# Patient Record
Sex: Male | Born: 1980 | Hispanic: Yes | Marital: Married | Smoking: Former smoker
Health system: Southern US, Community
[De-identification: ages and names within clinical notes are randomized; demographics above are authoritative.]

## PROBLEM LIST (undated history)

## (undated) DIAGNOSIS — C9590 Leukemia, unspecified not having achieved remission: Secondary | ICD-10-CM

## (undated) DIAGNOSIS — E119 Type 2 diabetes mellitus without complications: Secondary | ICD-10-CM

## (undated) HISTORY — DX: Leukemia, unspecified not having achieved remission: C95.90

## (undated) HISTORY — PX: CHEST SURGERY: SHX595

## (undated) HISTORY — DX: Type 2 diabetes mellitus without complications: E11.9

---

## 2011-05-27 DIAGNOSIS — C9200 Acute myeloblastic leukemia, not having achieved remission: Secondary | ICD-10-CM | POA: Insufficient documentation

## 2011-05-27 DIAGNOSIS — Z9481 Bone marrow transplant status: Secondary | ICD-10-CM | POA: Insufficient documentation

## 2012-01-07 DIAGNOSIS — H109 Unspecified conjunctivitis: Secondary | ICD-10-CM | POA: Insufficient documentation

## 2012-05-13 DIAGNOSIS — D89813 Graft-versus-host disease, unspecified: Secondary | ICD-10-CM | POA: Insufficient documentation

## 2013-11-12 ENCOUNTER — Ambulatory Visit (INDEPENDENT_AMBULATORY_CARE_PROVIDER_SITE_OTHER): Payer: BC Managed Care – PPO | Admitting: Family Medicine

## 2013-11-12 VITALS — BP 110/70 | HR 121 | Temp 100.0°F | Resp 18 | Ht 62.5 in | Wt 190.4 lb

## 2013-11-12 DIAGNOSIS — C9200 Acute myeloblastic leukemia, not having achieved remission: Secondary | ICD-10-CM

## 2013-11-12 DIAGNOSIS — J029 Acute pharyngitis, unspecified: Secondary | ICD-10-CM

## 2013-11-12 LAB — POCT CBC
Granulocyte percent: 55.7 %G (ref 37–80)
HCT, POC: 43.3 % — AB (ref 43.5–53.7)
Hemoglobin: 14.2 g/dL (ref 14.1–18.1)
Lymph, poc: 1.7 (ref 0.6–3.4)
MCH, POC: 31.6 pg — AB (ref 27–31.2)
MCHC: 32.8 g/dL (ref 31.8–35.4)
MCV: 96.5 fL (ref 80–97)
MID (cbc): 0.4 (ref 0–0.9)
MPV: 8.6 fL (ref 0–99.8)
POC Granulocyte: 2.6 (ref 2–6.9)
POC LYMPH PERCENT: 36.1 %L (ref 10–50)
POC MID %: 8.2 %M (ref 0–12)
Platelet Count, POC: 200 10*3/uL (ref 142–424)
RBC: 4.49 M/uL — AB (ref 4.69–6.13)
RDW, POC: 13.6 %
WBC: 4.7 10*3/uL (ref 4.6–10.2)

## 2013-11-12 LAB — POCT RAPID STREP A (OFFICE): Rapid Strep A Screen: NEGATIVE

## 2013-11-12 MED ORDER — AMOXICILLIN 875 MG PO TABS: 875.0000 mg | ORAL_TABLET | Freq: Two times a day (BID) | ORAL | Status: DC

## 2013-11-12 NOTE — Progress Notes (Signed)
This chart was scribed for Robyn Haber, MD by Vernell Barrier, Medical Scribe. This patient's care was started at 6:33 PM  Patient ID: Tim Calderon MRN: [id number], DOB: [birth year], 33 y.o. Date of Encounter: 11/12/2013, 6:32 PM  Primary Physician: No primary provider on file.  Chief Complaint: fever and sore throat  HPI: 33 y.o. year old male with history below presents with fever, sore throat, and mild cough onset 2 days ago. 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 Pt works in Architect.   Past Medical History  Diagnosis Date   Leukemia      Home Meds: Prior to Admission medications   Medication Sig Start Date End Date Taking? Authorizing Provider  ibuprofen (ADVIL,MOTRIN) 100 MG tablet Take 100 mg by mouth as needed for fever.   Yes Historical Provider, MD    Allergies:  Allergies  Allergen Reactions   Cefepime    Ceftazidime    Cytarabine    Vancomycin     History   Social History   Marital Status: Married    Spouse Name: N/A    Number of Children: N/A   Years of Education: N/A   Occupational History   Not on file.   Social History Main Topics   Smoking status: Never Smoker    Smokeless tobacco: Not on file   Alcohol Use: Not on file   Drug Use: Not on file   Sexual Activity: Not on file   Other Topics Concern   Not on file   Social History Narrative   No narrative on file     Review of Systems: Constitutional: negative for chills, night sweats, weight changes, or fatigue. Positive for fever. HEENT: negative for vision changes, hearing loss, congestion, rhinorrhea, ST, epistaxis, or sinus pressure. Positive for sore throat. Cardiovascular: negative for chest pain or palpitations Respiratory: negative for hemoptysis, wheezing, shortness of breath. Positive for cough. Abdominal: negative for abdominal pain, nausea, vomiting, diarrhea, or constipation Dermatological:  negative for rash Neurologic: negative for headache, dizziness, or syncope All other systems reviewed and are otherwise negative with the exception to those above and in the HPI.   Physical Exam: Blood pressure 110/70, pulse 121, temperature 100 F (37.8 C), temperature source Oral, resp. rate 18, height 5' 2.5" (1.588 m), weight 190 lb 6.4 oz (86.365 kg), SpO2 96.00%., Body mass index is 34.25 kg/(m^2). General: Well developed, well nourished, in no acute distress. Head: Normocephalic, atraumatic, eyes without discharge, sclera non-icteric, nares are without discharge. Bilateral auditory canals clear, TM's are without perforation, pearly grey and translucent with reflective cone of light bilaterally. Oral cavity moist, posterior pharynx without exudate, erythema, peritonsillar abscess, or post nasal drip.  Neck: Supple. No thyromegaly. Full ROM. No lymphadenopathy. Lungs: Clear bilaterally to auscultation without wheezes, rales, or rhonchi. Breathing is unlabored. Heart: RRR with S1 S2. No murmurs, rubs, or gallops appreciated. Abdomen: Soft, non-tender, non-distended with normoactive bowel sounds. No hepatomegaly. No rebound/guarding. No obvious abdominal masses. Msk:  Strength and tone normal for age. Extremities/Skin: Warm and dry. No clubbing or cyanosis. No edema. No rashes or suspicious lesions. Neuro: Alert and oriented X 3. Moves all extremities spontaneously. Gait is normal. CNII-XII grossly in tact. Psych:  Responds to questions appropriately with a normal affect.   Labs: Results for orders placed in visit on 11/12/13  POCT CBC      Result Value Ref Range   WBC 4.7  4.6 - 10.2 K/uL  Lymph, poc 1.7  0.6 - 3.4   POC LYMPH PERCENT 36.1  10 - 50 %L   MID (cbc) 0.4  0 - 0.9   POC MID % 8.2  0 - 12 %M   POC Granulocyte 2.6  2 - 6.9   Granulocyte percent 55.7  37 - 80 %G   RBC 4.49 (*) 4.69 - 6.13 M/uL   Hemoglobin 14.2  14.1 - 18.1 g/dL   HCT, POC 43.3 (*) 43.5 - 53.7 %   MCV  96.5  80 - 97 fL   MCH, POC 31.6 (*) 27 - 31.2 pg   MCHC 32.8  31.8 - 35.4 g/dL   RDW, POC 13.6     Platelet Count, POC 200  142 - 424 K/uL   MPV 8.6  0 - 99.8 fL  POCT RAPID STREP A (OFFICE)      Result Value Ref Range   Rapid Strep A Screen Negative  Negative      ASSESSMENT AND PLAN:  33 y.o. year old male with acute pharyngitis with fever.  H/O myelocitic leukemia    Signed, Robyn Haber, MD 11/12/2013 6:32 PM

## 2013-11-12 NOTE — Patient Instructions (Signed)
Dolor de garganta  (Sore Throat)  El dolor de garganta es el dolor, ardor, irritacin o sensacin de picazn en la garganta. Generalmente hay dolor o molestias al tragar o hablar. Un dolor de garganta puede estar acompaado de otros sntomas, como tos, estornudos, fiebre y ganglios hinchados en el cuello. Generalmente es Software engineer signo de otra enfermedad, como un resfrio, gripe, anginas o mononucleosis (conocida como mono). La mayor parte de los dolores de garganta desaparecen sin tratamiento mdico. CAUSAS  Las causas ms comunes de dolor de garganta son:   Infecciones virales, como un resfrio, gripe o mononucleosis.  Infeccin bacteriana, como faringitis estreptoccica, amigdalitis, o tos ferina.  Alergias estacionales.  La sequedad en el aire.  Algunos irritantes, como el humo o la polucin.  Reflujo gastroesofgico. INSTRUCCIONES PARA EL CUIDADO EN EL HOGAR   Tome slo la medicacin que le indic el mdico.  Debe ingerir gran cantidad de lquido para mantener la orina de tono claro o color amarillo plido.  Descanse todo lo que sea necesario.  Trate de usar [employer] para la garganta, pastillas o chupe caramelos duros para Best boy (si es mayor de 4 aos o segn lo que le indiquen).  Beba lquidos calientes, como caldos, infusiones de hierbas o agua caliente con miel para calmar el dolor momentneamente. Tambin puede comer o beber lquidos fros o congelados tales como paletas de hielo congelado.  Haga grgaras con agua con sal (mezclar 1 cucharadita de sal en 8 onzas [250 cm3] de agua).  No fume, y evite el humo de otros fumadores.  Ponga un humidificador de vapor fro en la habitacin por la noche para humedecer el aire. Tambin se puede activar en una ducha de agua caliente y sentarse en el bao con la puerta cerrada durante 5-10 minutos. SOLICITE ATENCIN MDICA DE INMEDIATO SI:   Tiene dificultad para respirar.  No puede tragar lquidos, alimentos blandos, o  su saliva.  Usted tiene ms inflamacin en la garganta.  El dolor de garganta no mejora en 7 das.  Tiene nuseas o vmitos.  Tiene fiebre o sntomas que persisten durante ms de 2 o 3 das.  Tiene fiebre y los sntomas empeoran de manera sbita. ASEGRESE DE QUE:   Comprende estas instrucciones.  Controlar su enfermedad.  Solicitar ayuda de inmediato si no mejora o si empeora. Document Released: 08/11/2005 Document Revised: 07/28/2012 [facility] Patient Information 2014 [place], [place]. Sore Throat A sore throat is pain, burning, irritation, or scratchiness of the throat. There is often pain or tenderness when swallowing or talking. A sore throat may be accompanied by other symptoms, such as coughing, sneezing, fever, and swollen neck glands. A sore throat is often the first sign of another sickness, such as a cold, flu, strep throat, or mononucleosis (commonly known as mono). Most sore throats go away without medical treatment. CAUSES  The most common causes of a sore throat include:  A viral infection, such as a cold, flu, or mono.  A bacterial infection, such as strep throat, tonsillitis, or whooping cough.  Seasonal allergies.  Dryness in the air.  Irritants, such as smoke or pollution.  Gastroesophageal reflux disease (GERD). HOME CARE INSTRUCTIONS   Only take over-the-counter medicines as directed by your caregiver.  Drink enough fluids to keep your urine clear or pale yellow.  Rest as needed.  Try using throat sprays, lozenges, or sucking on hard candy to ease any pain (if older than 4 years or as directed).  Sip warm liquids, such as  broth, herbal tea, or warm water with honey to relieve pain temporarily. You may also eat or drink cold or frozen liquids such as frozen ice pops.  Gargle with salt water (mix 1 tsp salt with 8 oz of water).  Do not smoke and avoid secondhand smoke.  Put a cool-mist humidifier in your bedroom at night to moisten the air. You  can also turn on a hot shower and sit in the bathroom with the door closed for 5 10 minutes. SEEK IMMEDIATE MEDICAL CARE IF:  You have difficulty breathing.  You are unable to swallow fluids, soft foods, or your saliva.  You have increased swelling in the throat.  Your sore throat does not get better in 7 days.  You have nausea and vomiting.  You have a fever or persistent symptoms for more than 2 3 days.  You have a fever and your symptoms suddenly get worse. MAKE SURE YOU:   Understand these instructions.  Will watch your condition.  Will get help right away if you are not doing well or get worse. Document Released: 09/18/2004 Document Revised: 07/28/2012 Document Reviewed: 04/18/2012 [facility] Patient Information 2014 [place], [place].

## 2016-01-11 DIAGNOSIS — C9201 Acute myeloblastic leukemia, in remission: Secondary | ICD-10-CM | POA: Diagnosis not present

## 2016-01-11 DIAGNOSIS — M25562 Pain in left knee: Secondary | ICD-10-CM | POA: Diagnosis not present

## 2016-01-11 DIAGNOSIS — R21 Rash and other nonspecific skin eruption: Secondary | ICD-10-CM | POA: Diagnosis not present

## 2016-01-11 DIAGNOSIS — Z9484 Stem cells transplant status: Secondary | ICD-10-CM | POA: Diagnosis not present

## 2016-01-11 DIAGNOSIS — Z9481 Bone marrow transplant status: Secondary | ICD-10-CM | POA: Diagnosis not present

## 2016-01-11 DIAGNOSIS — M25569 Pain in unspecified knee: Secondary | ICD-10-CM | POA: Diagnosis not present

## 2016-01-11 DIAGNOSIS — M25561 Pain in right knee: Secondary | ICD-10-CM | POA: Diagnosis not present

## 2016-01-11 DIAGNOSIS — Z9889 Other specified postprocedural states: Secondary | ICD-10-CM | POA: Diagnosis not present

## 2016-01-11 DIAGNOSIS — Z01812 Encounter for preprocedural laboratory examination: Secondary | ICD-10-CM | POA: Diagnosis not present

## 2016-05-09 DIAGNOSIS — Z23 Encounter for immunization: Secondary | ICD-10-CM | POA: Diagnosis not present

## 2016-07-29 DIAGNOSIS — Z3189 Encounter for other procreative management: Secondary | ICD-10-CM | POA: Diagnosis not present

## 2017-01-16 DIAGNOSIS — C9201 Acute myeloblastic leukemia, in remission: Secondary | ICD-10-CM | POA: Diagnosis not present

## 2017-01-16 DIAGNOSIS — Z9481 Bone marrow transplant status: Secondary | ICD-10-CM | POA: Diagnosis not present

## 2017-01-16 DIAGNOSIS — Z9484 Stem cells transplant status: Secondary | ICD-10-CM | POA: Diagnosis not present

## 2017-01-16 DIAGNOSIS — N469 Male infertility, unspecified: Secondary | ICD-10-CM | POA: Diagnosis not present

## 2017-05-26 ENCOUNTER — Ambulatory Visit (INDEPENDENT_AMBULATORY_CARE_PROVIDER_SITE_OTHER): Payer: BLUE CROSS/BLUE SHIELD | Admitting: Physician Assistant

## 2017-05-26 ENCOUNTER — Encounter: Payer: Self-pay | Admitting: Physician Assistant

## 2017-05-26 VITALS — BP 110/60 | HR 71 | Temp 98.7°F | Resp 16 | Ht 63.0 in | Wt 203.6 lb

## 2017-05-26 DIAGNOSIS — Z13228 Encounter for screening for other metabolic disorders: Secondary | ICD-10-CM | POA: Diagnosis not present

## 2017-05-26 DIAGNOSIS — Z0000 Encounter for general adult medical examination without abnormal findings: Secondary | ICD-10-CM | POA: Diagnosis not present

## 2017-05-26 DIAGNOSIS — Z1329 Encounter for screening for other suspected endocrine disorder: Secondary | ICD-10-CM | POA: Diagnosis not present

## 2017-05-26 DIAGNOSIS — Z130 Encounter for screening for diseases of the blood and blood-forming organs and certain disorders involving the immune mechanism: Secondary | ICD-10-CM

## 2017-05-26 DIAGNOSIS — Z23 Encounter for immunization: Secondary | ICD-10-CM

## 2017-05-26 NOTE — Patient Instructions (Signed)
     IF you received an x-ray today, you will receive an invoice from [facility]. Please contact [facility] at [phone] with questions or concerns regarding your invoice.   IF you received labwork today, you will receive an invoice from [facility]. Please contact [facility] at [phone] with questions or concerns regarding your invoice.   Our billing staff will not be able to assist you with questions regarding bills from these companies.  You will be contacted with the lab results as soon as they are available. The fastest way to get your results is to activate your My Chart account. Instructions are located on the last page of this paperwork. If you have not heard from us regarding the results in 2 weeks, please contact this office.     

## 2017-05-26 NOTE — Progress Notes (Signed)
    05/26/2017 5:48 PM   DOB: 07/02/1981 / MRN: [id number]  SUBJECTIVE:  Tim Calderon is a 36 y.o. male presenting for annual exam.  He has a history of acute myeloid leukemia and was treated by bone marrow transplant at [facility] and tells me that he is in remission as of right now.  He takes no medication. Tells me that all of his family has diabetes.   There is no immunization history for the selected administration types on file for this patient.   He is allergic to cefepime; ceftazidime; cytarabine; and vancomycin.   He  has a past medical history of Leukemia ([place]).    He  reports that he has never smoked. He has never used smokeless tobacco. He reports that he does not use drugs. He  has no sexual activity history on file. The patient  has a past surgical history that includes Chest surgery.  His family history includes Diabetes in his brother, father, mother, and sister.  Review of Systems  Constitutional: Negative for chills, diaphoresis and fever.  Eyes: Negative.   Respiratory: Negative for cough, hemoptysis, sputum production, shortness of breath and wheezing.   Cardiovascular: Negative for chest pain, orthopnea and leg swelling.  Gastrointestinal: Negative for abdominal pain, blood in stool, constipation, diarrhea, heartburn, melena, nausea and vomiting.  Genitourinary: Negative for flank pain.  Skin: Negative for rash.  Neurological: Negative for dizziness, sensory change, speech change, focal weakness and headaches.    The problem list and medications were reviewed and updated by myself where necessary and exist elsewhere in the encounter.   OBJECTIVE:  BP 110/60   Pulse 71   Temp 98.7 F (37.1 C) (Oral)   Resp 16   Ht 5\' 3"  (1.6 m)   Wt 203 lb 9.6 oz (92.4 kg)   SpO2 97%   BMI 36.07 kg/m   Physical Exam  Constitutional: He is oriented to person, place, and time. He appears well-developed. He is active and cooperative.  Non-toxic appearance.    Cardiovascular: Normal rate and regular rhythm.   Pulmonary/Chest: Effort normal and breath sounds normal. No tachypnea.  Musculoskeletal: Normal range of motion. He exhibits no edema.  Neurological: He is alert and oriented to person, place, and time.  Skin: Skin is warm and dry. He is not diaphoretic. No pallor.  Vitals reviewed.   No results found for this or any previous visit (from the past 72 hour(s)).  No results found.  ASSESSMENT AND PLAN:  [name] was seen today for establish care.  Diagnoses and all orders for this visit:  Physical exam  Need for diphtheria-tetanus-pertussis (Tdap) vaccine -     Tdap vaccine greater than or equal to 7yo IM  Needs flu shot -     Flu Vaccine QUAD 36+ mos IM  Screening for endocrine, metabolic and immunity disorder -     Lipid panel -     Hemoglobin A1c -     HIV antibody -     Basic metabolic panel    The patient is advised to call or return to clinic if he does not see an improvement in symptoms, or to seek the care of the closest emergency department if he worsens with the above plan.   Philis Fendt, MHS, PA-C [facility] 05/26/2017 5:48 PM

## 2017-05-27 LAB — LIPID PANEL
CHOL/HDL RATIO: 5.6 ratio — AB (ref 0.0–5.0)
Cholesterol, Total: 179 mg/dL (ref 100–199)
HDL: 32 mg/dL — AB (ref >39)
LDL CALC: 79 mg/dL (ref 0–99)
TRIGLYCERIDES: 340 mg/dL — AB (ref 0–149)
VLDL CHOLESTEROL CAL: 68 mg/dL — AB (ref 5–40)

## 2017-05-27 LAB — HEMOGLOBIN A1C
Est. average glucose Bld gHb Est-mCnc: 117 mg/dL
Hgb A1c MFr Bld: 5.7 % — ABNORMAL HIGH (ref 4.8–5.6)

## 2017-05-27 LAB — BASIC METABOLIC PANEL
BUN / CREAT RATIO: 15 (ref 9–20)
BUN: 14 mg/dL (ref 6–20)
CO2: 19 mmol/L — ABNORMAL LOW (ref 20–29)
Calcium: 9.3 mg/dL (ref 8.7–10.2)
Chloride: 105 mmol/L (ref 96–106)
Creatinine, Ser: 0.92 mg/dL (ref 0.76–1.27)
GFR calc Af Amer: 123 mL/min/{1.73_m2} (ref >59)
GFR, EST NON AFRICAN AMERICAN: 107 mL/min/{1.73_m2} (ref >59)
GLUCOSE: 103 mg/dL — AB (ref 65–99)
POTASSIUM: 4.3 mmol/L (ref 3.5–5.2)
Sodium: 141 mmol/L (ref 134–144)

## 2017-05-27 LAB — HIV ANTIBODY (ROUTINE TESTING W REFLEX): HIV Screen 4th Generation wRfx: NONREACTIVE

## 2017-05-27 NOTE — Progress Notes (Signed)
Please make patient aware of results via letter. I'd like to see him back in one year to trend these labs.  He is a very early prediabetes.  Eating less carbohydrate/sugar and getting more exercise.  Philis Fendt PA-C, 05/27/2017 11:57 AM

## 2017-05-28 ENCOUNTER — Encounter: Payer: Self-pay | Admitting: *Deleted

## 2017-08-12 DIAGNOSIS — Z3189 Encounter for other procreative management: Secondary | ICD-10-CM | POA: Diagnosis not present

## 2018-01-22 DIAGNOSIS — K1370 Unspecified lesions of oral mucosa: Secondary | ICD-10-CM | POA: Diagnosis not present

## 2018-01-22 DIAGNOSIS — Z9484 Stem cells transplant status: Secondary | ICD-10-CM | POA: Diagnosis not present

## 2018-01-22 DIAGNOSIS — Z9481 Bone marrow transplant status: Secondary | ICD-10-CM | POA: Diagnosis not present

## 2018-01-22 DIAGNOSIS — C9201 Acute myeloblastic leukemia, in remission: Secondary | ICD-10-CM | POA: Diagnosis not present

## 2018-01-22 DIAGNOSIS — N469 Male infertility, unspecified: Secondary | ICD-10-CM | POA: Diagnosis not present

## 2018-04-05 ENCOUNTER — Encounter: Payer: Self-pay | Admitting: Urgent Care

## 2018-04-05 ENCOUNTER — Ambulatory Visit (INDEPENDENT_AMBULATORY_CARE_PROVIDER_SITE_OTHER): Payer: BLUE CROSS/BLUE SHIELD | Admitting: Urgent Care

## 2018-04-05 VITALS — BP 114/74 | HR 69 | Temp 97.5°F | Resp 18 | Ht 63.0 in | Wt 204.6 lb

## 2018-04-05 DIAGNOSIS — Z1329 Encounter for screening for other suspected endocrine disorder: Secondary | ICD-10-CM | POA: Diagnosis not present

## 2018-04-05 DIAGNOSIS — Z833 Family history of diabetes mellitus: Secondary | ICD-10-CM

## 2018-04-05 DIAGNOSIS — R635 Abnormal weight gain: Secondary | ICD-10-CM

## 2018-04-05 DIAGNOSIS — Z130 Encounter for screening for diseases of the blood and blood-forming organs and certain disorders involving the immune mechanism: Secondary | ICD-10-CM

## 2018-04-05 DIAGNOSIS — Z856 Personal history of leukemia: Secondary | ICD-10-CM

## 2018-04-05 DIAGNOSIS — Z0000 Encounter for general adult medical examination without abnormal findings: Secondary | ICD-10-CM

## 2018-04-05 DIAGNOSIS — Z13228 Encounter for screening for other metabolic disorders: Secondary | ICD-10-CM

## 2018-04-05 DIAGNOSIS — Z6836 Body mass index (BMI) 36.0-36.9, adult: Secondary | ICD-10-CM

## 2018-04-05 DIAGNOSIS — E669 Obesity, unspecified: Secondary | ICD-10-CM

## 2018-04-05 NOTE — Progress Notes (Signed)
MRN: [id number]  Subjective:   [name] is a 37 y.o. male presenting for an annual physical exam. Patient is married, has children. Has good relationships at home, has a good support network. Works as a Glass blower/designer. Denies smoking cigarettes or drinking alcohol.   Medical care team includes: PCP: Patient, No Pcp Per Vision: No visual deficits. Dental: Gets regular dental care. Specialists: Has had leukemia in the past, currently in remission, gets follow up in [facility].  Health Maintenance: Age appropriate screening completed.  Tim Calderon is not currently taking any medications. He is allergic to cefepime; ceftazidime; cytarabine; and vancomycin. Tim Calderon  has a past medical history of Leukemia ([place]). Also  has a past surgical history that includes Chest surgery. His family history includes Diabetes in his brother, father, mother, and sister.  Review of Systems  Constitutional: Negative for chills, diaphoresis, fever, malaise/fatigue and weight loss.  HENT: Negative for congestion, ear discharge, ear pain, hearing loss, nosebleeds, sore throat and tinnitus.   Eyes: Negative for blurred vision, double vision, photophobia, pain, discharge and redness.  Respiratory: Negative for cough, shortness of breath and wheezing.   Cardiovascular: Negative for chest pain, palpitations and leg swelling.  Gastrointestinal: Negative for abdominal pain, blood in stool, constipation, diarrhea, nausea and vomiting.  Genitourinary: Negative for dysuria, flank pain, frequency, hematuria and urgency.  Musculoskeletal: Negative for back pain, joint pain and myalgias.  Skin: Negative for itching and rash.  Neurological: Negative for dizziness, tingling, seizures, loss of consciousness, weakness and headaches.  Endo/Heme/Allergies: Negative for polydipsia.  Psychiatric/Behavioral: Negative for depression, hallucinations, memory loss, substance abuse and suicidal ideas. The patient is not  nervous/anxious and does not have insomnia.    Objective:   Vitals: BP 114/74   Pulse 69   Temp (!) 97.5 F (36.4 C) (Oral)   Resp 18   Ht 5\' 3"  (1.6 m)   Wt 204 lb 9.6 oz (92.8 kg)   SpO2 95%   BMI 36.24 kg/m   Physical Exam  Constitutional: He is oriented to person, place, and time. He appears well-developed and well-nourished.  HENT:  TM's intact bilaterally, no effusions or erythema. Nasal turbinates pink and moist, nasal passages patent. No sinus tenderness. Oropharynx clear, mucous membranes moist, dentition in good repair.  Eyes: Pupils are equal, round, and reactive to light. Conjunctivae and EOM are normal. Right eye exhibits no discharge. Left eye exhibits no discharge. No scleral icterus.  Neck: Normal range of motion. Neck supple. No thyromegaly present.  Cardiovascular: Normal rate, regular rhythm, normal heart sounds and intact distal pulses. Exam reveals no gallop and no friction rub.  No murmur heard. Pulmonary/Chest: Effort normal and breath sounds normal. No stridor. No respiratory distress. He has no wheezes. He has no rales.  Abdominal: Soft. Bowel sounds are normal. He exhibits no distension and no mass. There is no tenderness. There is no rebound and no guarding.  Musculoskeletal: Normal range of motion. He exhibits no edema or tenderness.  Lymphadenopathy:    He has no cervical adenopathy.  Neurological: He is alert and oriented to person, place, and time. He has normal reflexes. He displays normal reflexes. Coordination normal.  Skin: Skin is warm and dry. No rash noted. No erythema. No pallor.  Psychiatric: He has a normal mood and affect.   Assessment and Plan :   Annual physical exam  Screening for endocrine, metabolic and immunity disorder  Screening for deficiency anemia - Plan: CBC  Family history of diabetes mellitus  Class 2 obesity without serious comorbidity with body mass index (BMI) of 36.0 to 36.9 in adult, unspecified obesity type -  Plan: Comprehensive metabolic panel, Lipid panel, TSH  History of leukemia - Plan: CBC, Comprehensive metabolic panel, Lipid panel  Patient is medically stable and very pleasant.  Labs pending.  States that he should have a DOT and respiratory physical which he states he will do so or else provided by his employer.  States that this is been the case in the past.  Counseled that if he needs a DOT we can perform that here.  Patient will return to clinic as needed. Discussed healthy lifestyle, diet, exercise, preventative care, vaccinations, and addressed patient's concerns.     Jaynee Eagles, PA-C [facility] [phone] 04/05/2018  2:59 PM

## 2018-04-05 NOTE — Patient Instructions (Addendum)
Mantenimiento de Scientist, forensic hombres (Health Maintenance, Male) Un estilo de vida saludable y los cuidados preventivos son importantes para la salud y Musician. Pregntele al mdico cul es el cronograma de exmenes peridicos adecuado para usted. [place]? Consuma una dieta saludable  Coma muchas verduras, frutas, cereales integrales, productos lcteos con bajo contenido de grasa y Advertising account planner.  No consuma muchos alimentos de alto contenido de grasas slidas, azcares agregados o sal. Mantenga un peso saludable La actividad fsica habitual puede ayudarlo a Science writer o mantener un peso saludable. Deber hacer lo siguiente:  Realizar al menos 152minutos de actividad fsica por semana. El ejercicio debe aumentar la frecuencia cardaca y Actor la transpiracin (ejercicio de [place]).  Hacer ejercicios de entrenamiento de fuerza por lo [employer] por semana. Controlarse los niveles de colesterol y lpidos en la sangre  Hgase anlisis de sangre para controlar los lpidos y el colesterol cada 5aos a partir de los 35aos. Si tiene un riesgo alto de Best boy cardiopatas coronarias, debe comenzar a [employer] de [place] a los [place]. Es posible que Automotive engineer los niveles de colesterol con mayor frecuencia si: ? Sus niveles de lpidos y colesterol son altos. ? Es mayor de 73UKG. ? Tiene un riesgo alto de tener cardiopatas coronarias. QU DEBO SABER SOBRE LAS PRUEBAS DE [place]? Muchos tipos de cncer se pueden detectar de manera temprana y a menudo prevenirse. Cncer de pulmn  Debe someterse a pruebas de deteccin de cncer de pulmn todos los aos en los siguientes casos: ? Si fuma actualmente y lo ha hecho durante por lo menos 30aos. ? Si fue fumador que dej el hbito en el trmino de los ltimos 15aos.  Hable con el mdico sobre las opciones en relacin con los estudios de deteccin, cundo  debe comenzar a Insurance underwriter y con Armed forces operational officer. Cncer colorrectal  Generalmente, las pruebas de deteccin habituales del cncer colorrectal comienzan a los 50aos y deben repetirse cada 5 a 10aos hasta los 75aos. Es posible que tenga que hacerse las pruebas con mayor frecuencia si se detectan formas tempranas de plipos precancerosos o pequeos bultos. Sin embargo, el mdico podr aconsejarle que lo haga antes, si tiene factores de riesgo para el cncer de colon.  El mdico puede recomendarle que use kits de prueba caseros para Publishing rights manager oculta en la materia fecal.  Se puede usar una pequea cmara en el extremo de un tubo para examinar el colon (sigmoidoscopia o colonoscopia). Este estudio [employer] formas ms tempranas de Surveyor, minerals. Cncer de prstata y de testculo  En funcin de la edad y del [place] de salud general, el mdico puede realizarle determinados estudios de deteccin del cncer de prstata y de testculo.  Hable con el mdico sobre cualquier sntoma o acerca de las inquietudes que tenga sobre el cncer de prstata o de testculo. Cncer de piel  Revise la piel de la cabeza a los pies con regularidad.  Informe al mdico si aparecen nuevos lunares o si nota cambios en los que ya tiene, especialmente en estos casos: ? Si hay un cambio en el tamao, la forma o el color del lunar. ? Si tiene un lunar que es ms grande que el tamao de una goma de Games developer.  Siempre use pantalla solar. Aplquese pantalla solar de Lanell Matar y repetida a lo largo del Training and development officer.  Use mangas y [employer], un sombrero de ala ancha y gafas para  el sol cuando est al Alexis Goodell, para protegerse. QU DEBO SABER SOBRE LAS CARDIOPATAS CORONARIAS, LA DIABETES Y LA HIPERTENSIN ARTERIAL?  Si usted tiene entre 18 y 71aos, debe medirse la presin arterial cada 3a 5aos. Si usted tiene 40aos o ms, debe medirse la presin arterial [employer]. Debe medirse la presin arterial  dos veces: una vez cuando est en un hospital o una clnica y la otra vez cuando est en otro sitio. Registre el promedio de [employer]. Para controlar su presin arterial cuando no est en un hospital o [place], puede usar lo siguiente: ? Jorje Guild automtica para medir la presin arterial en una farmacia. ? Un monitor para medir la presin arterial en el hogar.  Hable con el mdico [employer] ideales de la presin arterial.  Si tiene entre 8 y 79aos, consltele al mdico si debe tomar aspirina para evitar las cardiopatas coronarias.  Hgase anlisis habituales de deteccin de la diabetes; para ello, contrlese la glucemia en ayunas. ? Si su peso es normal y tiene un bajo riesgo de padecer diabetes, realcese este anlisis cada tres aos despus de los 45aos. ? Si tiene sobrepeso y un alto riesgo de padecer diabetes, considere someterse a este anlisis antes o con mayor frecuencia.  Para los hombres que tienen entre 72 y 34aos, y son o han sido fumadores, se recomienda un nico estudio con ecografa para Hydrographic surveyor un aneurisma de aorta abdominal (AAA). QU DEBO SABER SOBRE LA PREVENCIN DE LAS INFECCIONES? HepatitisB Si tiene un riesgo ms alto de Museum/gallery curator hepatitis B, debe someterse a un examen de deteccin de este virus. Hable con el mdico para determinar si corre riesgo de tener una infeccin por hepatitisB. Hepatitis C Se recomienda un anlisis de [place] para:  Todos los que nacieron entre 1945 y [phone].  Todas las personas que tengan un riesgo de haber contrado hepatitis C. Enfermedades de transmisin sexual (ETS)  Debe realizarse pruebas de Programme researcher, broadcasting/film/video de las ETS todos los aos, incluidas la gonorrea y la clamidia, en estos casos: ? Es sexualmente activo y es menor de [place]. ? Es mayor de 24aos, y Investment banker, operational informa que corre riesgo de tener este tipo de infecciones. ? La actividad sexual ha cambiado desde que le hicieron la ltima prueba de  deteccin y tiene un riesgo mayor de Best boy clamidia o Radio broadcast assistant. Pregntele al mdico si usted tiene riesgo.  Consulte a su mdico para saber si tiene un alto riesgo de infectarse por el VIH. El mdico puede recomendarle un medicamento de venta con receta para ayudar a evitar la infeccin por el VIH. QU MS PUEDO HACER?  Realcese los estudios de rutina de la salud, dentales y de Public librarian.  [place] (inmunizaciones).  No consuma ningn producto que contenga tabaco, lo que incluye cigarrillos, tabaco de Higher education careers adviser y Psychologist, sport and exercise. Si necesita ayuda para dejar de fumar, consulte al mdico.  Limite el consumo de alcohol a no ms de 32medidas por da. [employer] a 12 onzas de cerveza, 5onzas de vino o 1onzas de bebidas alcohlicas de alta graduacin.  No consuma drogas.  No comparta agujas.  Solicite ayuda a su mdico si necesita apoyo o informacin para abandonar las drogas.  Informe a su mdico si a menudo se siente deprimido.  Notifique a su mdico si alguna vez ha sido vctima de abuso o si no se siente seguro en su hogar. Esta informacin no tiene Psychologist, clinical  consejo del mdico. Asegrese de hacerle al mdico cualquier pregunta que tenga. Document Released: 02/07/2008 Document Revised: 09/01/2014 Document Reviewed: 05/15/2015 Elsevier Interactive Patient Education  2018 [employer].     IF you received an x-ray today, you will receive an invoice from [facility]. Please contact [facility] at [phone] with questions or concerns regarding your invoice.   IF you received labwork today, you will receive an invoice from [place]. Please contact [facility] at [phone] with questions or concerns regarding your invoice.   Our billing staff will not be able to assist you with questions regarding bills from these companies.  You will be contacted with the lab results as soon as they are available. The fastest  way to get your results is to activate your My Chart account. Instructions are located on the last page of this paperwork. If you have not heard from [place] regarding the results in 2 weeks, please contact this office.

## 2018-04-06 LAB — COMPREHENSIVE METABOLIC PANEL
A/G RATIO: 1.5 (ref 1.2–2.2)
ALK PHOS: 63 IU/L (ref 39–117)
ALT: 57 IU/L — ABNORMAL HIGH (ref 0–44)
AST: 36 IU/L (ref 0–40)
Albumin: 4.3 g/dL (ref 3.5–5.5)
BILIRUBIN TOTAL: 0.3 mg/dL (ref 0.0–1.2)
BUN/Creatinine Ratio: 12 (ref 9–20)
BUN: 15 mg/dL (ref 6–20)
CO2: 22 mmol/L (ref 20–29)
Calcium: 9 mg/dL (ref 8.7–10.2)
Chloride: 105 mmol/L (ref 96–106)
Creatinine, Ser: 1.21 mg/dL (ref 0.76–1.27)
GFR calc Af Amer: 88 mL/min/{1.73_m2} (ref >59)
GFR calc non Af Amer: 76 mL/min/{1.73_m2} (ref >59)
GLOBULIN, TOTAL: 2.9 g/dL (ref 1.5–4.5)
Glucose: 111 mg/dL — ABNORMAL HIGH (ref 65–99)
Potassium: 4.2 mmol/L (ref 3.5–5.2)
SODIUM: 141 mmol/L (ref 134–144)
Total Protein: 7.2 g/dL (ref 6.0–8.5)

## 2018-04-06 LAB — CBC
Hematocrit: 44.2 % (ref 37.5–51.0)
Hemoglobin: 14.7 g/dL (ref 13.0–17.7)
MCH: 32 pg (ref 26.6–33.0)
MCHC: 33.3 g/dL (ref 31.5–35.7)
MCV: 96 fL (ref 79–97)
PLATELETS: 229 10*3/uL (ref 150–450)
RBC: 4.59 x10E6/uL (ref 4.14–5.80)
RDW: 14.5 % (ref 12.3–15.4)
WBC: 6.6 10*3/uL (ref 3.4–10.8)

## 2018-04-06 LAB — LIPID PANEL
CHOLESTEROL TOTAL: 165 mg/dL (ref 100–199)
Chol/HDL Ratio: 5 ratio (ref 0.0–5.0)
HDL: 33 mg/dL — ABNORMAL LOW (ref >39)
LDL Calculated: 79 mg/dL (ref 0–99)
TRIGLYCERIDES: 266 mg/dL — AB (ref 0–149)
VLDL Cholesterol Cal: 53 mg/dL — ABNORMAL HIGH (ref 5–40)

## 2018-04-06 LAB — TSH: TSH: 1.67 u[IU]/mL (ref 0.450–4.500)

## 2018-05-18 DIAGNOSIS — Z3189 Encounter for other procreative management: Secondary | ICD-10-CM | POA: Diagnosis not present

## 2018-06-12 DIAGNOSIS — Z3189 Encounter for other procreative management: Secondary | ICD-10-CM | POA: Diagnosis not present

## 2019-01-06 DIAGNOSIS — Z3189 Encounter for other procreative management: Secondary | ICD-10-CM | POA: Diagnosis not present

## 2019-06-17 DIAGNOSIS — L299 Pruritus, unspecified: Secondary | ICD-10-CM | POA: Diagnosis not present

## 2019-06-17 DIAGNOSIS — Z9484 Stem cells transplant status: Secondary | ICD-10-CM | POA: Diagnosis not present

## 2019-06-17 DIAGNOSIS — L819 Disorder of pigmentation, unspecified: Secondary | ICD-10-CM | POA: Diagnosis not present

## 2019-06-17 DIAGNOSIS — Z23 Encounter for immunization: Secondary | ICD-10-CM | POA: Diagnosis not present

## 2019-06-17 DIAGNOSIS — C9201 Acute myeloblastic leukemia, in remission: Secondary | ICD-10-CM | POA: Diagnosis not present

## 2019-06-17 DIAGNOSIS — Z9481 Bone marrow transplant status: Secondary | ICD-10-CM | POA: Diagnosis not present

## 2020-02-22 DIAGNOSIS — Z3189 Encounter for other procreative management: Secondary | ICD-10-CM | POA: Diagnosis not present

## 2020-10-21 ENCOUNTER — Other Ambulatory Visit: Payer: Self-pay

## 2020-10-21 ENCOUNTER — Emergency Department (HOSPITAL_COMMUNITY)
Admission: EM | Admit: 2020-10-21 | Discharge: 2020-10-21 | Disposition: A | Discharge status: Home / Self Care | Payer: BC Managed Care – PPO | Attending: Emergency Medicine | Admitting: Emergency Medicine

## 2020-10-21 ENCOUNTER — Emergency Department (HOSPITAL_COMMUNITY): Payer: BC Managed Care – PPO

## 2020-10-21 DIAGNOSIS — M791 Myalgia, unspecified site: Secondary | ICD-10-CM | POA: Insufficient documentation

## 2020-10-21 DIAGNOSIS — R197 Diarrhea, unspecified: Secondary | ICD-10-CM | POA: Insufficient documentation

## 2020-10-21 DIAGNOSIS — R509 Fever, unspecified: Secondary | ICD-10-CM | POA: Diagnosis not present

## 2020-10-21 DIAGNOSIS — R1032 Left lower quadrant pain: Secondary | ICD-10-CM | POA: Insufficient documentation

## 2020-10-21 DIAGNOSIS — Z20822 Contact with and (suspected) exposure to covid-19: Secondary | ICD-10-CM | POA: Diagnosis not present

## 2020-10-21 LAB — HEPATITIS PANEL, ACUTE
HCV Ab: NONREACTIVE
Hep A IgM: NONREACTIVE
Hep B C IgM: NONREACTIVE
Hepatitis B Surface Ag: NONREACTIVE

## 2020-10-21 LAB — COMPREHENSIVE METABOLIC PANEL
ALT: 177 U/L — ABNORMAL HIGH (ref 0–44)
AST: 115 U/L — ABNORMAL HIGH (ref 15–41)
Albumin: 3.5 g/dL (ref 3.5–5.0)
Alkaline Phosphatase: 60 U/L (ref 38–126)
Anion gap: 10 (ref 5–15)
BUN: 14 mg/dL (ref 6–20)
CO2: 22 mmol/L (ref 22–32)
Calcium: 8.3 mg/dL — ABNORMAL LOW (ref 8.9–10.3)
Chloride: 103 mmol/L (ref 98–111)
Creatinine, Ser: 1.06 mg/dL (ref 0.61–1.24)
GFR, Estimated: >60 mL/min (ref >60)
Glucose, Bld: 157 mg/dL — ABNORMAL HIGH (ref 70–99)
Potassium: 3.7 mmol/L (ref 3.5–5.1)
Sodium: 135 mmol/L (ref 135–145)
Total Bilirubin: 0.8 mg/dL (ref 0.3–1.2)
Total Protein: 6.8 g/dL (ref 6.5–8.1)

## 2020-10-21 LAB — CBC WITH DIFFERENTIAL/PLATELET
Abs Immature Granulocytes: 0.01 10*3/uL (ref 0.00–0.07)
Basophils Absolute: 0 10*3/uL (ref 0.0–0.1)
Basophils Relative: 1 %
Eosinophils Absolute: 0 10*3/uL (ref 0.0–0.5)
Eosinophils Relative: 0 %
HCT: 42.9 % (ref 39.0–52.0)
Hemoglobin: 14.8 g/dL (ref 13.0–17.0)
Immature Granulocytes: 0 %
Lymphocytes Relative: 37 %
Lymphs Abs: 1.6 10*3/uL (ref 0.7–4.0)
MCH: 32.2 pg (ref 26.0–34.0)
MCHC: 34.5 g/dL (ref 30.0–36.0)
MCV: 93.3 fL (ref 80.0–100.0)
Monocytes Absolute: 0.5 10*3/uL (ref 0.1–1.0)
Monocytes Relative: 11 %
Neutro Abs: 2.3 10*3/uL (ref 1.7–7.7)
Neutrophils Relative %: 51 %
Platelets: 144 10*3/uL — ABNORMAL LOW (ref 150–400)
RBC: 4.6 MIL/uL (ref 4.22–5.81)
RDW: 12.9 % (ref 11.5–15.5)
WBC: 4.4 10*3/uL (ref 4.0–10.5)
nRBC: 0 % (ref 0.0–0.2)

## 2020-10-21 LAB — LIPASE, BLOOD: Lipase: 43 U/L (ref 11–51)

## 2020-10-21 LAB — SARS CORONAVIRUS 2 (TAT 6-24 HRS): SARS Coronavirus 2: NEGATIVE

## 2020-10-21 MED ORDER — IOHEXOL 300 MG/ML  SOLN
100.0000 mL | Freq: Once | INTRAMUSCULAR | Status: AC | PRN
  Administered 2020-10-21: 100 mL via INTRAVENOUS

## 2020-10-21 MED ORDER — SODIUM CHLORIDE 0.9 % IV BOLUS
1000.0000 mL | Freq: Once | INTRAVENOUS | Status: AC
  Administered 2020-10-21: 1000 mL via INTRAVENOUS

## 2020-10-21 NOTE — ED Notes (Signed)
Patient transported to CT 

## 2020-10-21 NOTE — ED Provider Notes (Signed)
[facility] EMERGENCY DEPARTMENT Provider Note   CSN: [id number] Arrival date & time: 10/21/20  1307     History Chief Complaint  Patient presents with  . Diarrhea    Tim Calderon is a 40 y.o. male.  Tim Calderon is a 40 y.o. male with a history of leukemia in remission, who presents to the ED for evaluation of diarrhea and fever over the past 3 days.  He reports Friday he started having profuse diarrhea he reports numerous episodes of watery stools throughout the day.  No melena or hematochezia.  Denies nausea or vomiting.  Reports some cramping abdominal pain that gets worse before he has a bowel movement but no persistent pain.  Reports fevers up to 101 at home.  No cough, congestion, sore throat, and no known sick contacts.  Reports patient's son had 1 day of fever but no associated diarrhea.  No food out of the ordinary recently.  Has taken Tylenol for the fever with improvement, no other medications prior to arrival.  Has not been on any antibiotics recently.  Does report that today he has had some myalgias which he attributes to dehydration.  He has had decreased p.o. intake, because when he eats he feels like this goes straight through him.        Past Medical History:  Diagnosis Date  . Leukemia [facility])     Patient Active Problem List   Diagnosis Date Noted  . 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 CHEST SURGERY         Family History  Problem Relation Age of Onset  . Diabetes Mother   . Diabetes Father   . Diabetes Sister   . Diabetes Brother     Social History   Tobacco Use  . Smoking status: Never Smoker  . Smokeless tobacco: Never Used  Substance Use Topics  . Drug use: No    Home Medications Prior to Admission medications   Not on File    Allergies    Cefepime, Ceftazidime, Cytarabine, and  Vancomycin  Review of Systems   Review of Systems  Constitutional: Negative for chills and fever.  HENT: Negative.   Respiratory: Negative for cough and shortness of breath.   Cardiovascular: Negative for chest pain.  Gastrointestinal: Positive for abdominal pain and diarrhea. Negative for anal bleeding, blood in stool, nausea and vomiting.  Genitourinary: Negative for dysuria and frequency.  Musculoskeletal: Positive for myalgias. Negative for arthralgias.  Skin: Negative for color change and rash.  Neurological: Negative for dizziness, syncope and light-headedness.  All other systems reviewed and are negative.   Physical Exam Updated Vital Signs BP 121/80 (BP Location: Right Arm)   Pulse 93   Temp 99.8 F (37.7 C) (Oral)   Resp 20   SpO2 98%   Physical Exam Vitals and nursing note reviewed.  Constitutional:      General: He is not in acute distress.    Appearance: Normal appearance. He is well-developed and well-nourished. He is not ill-appearing or diaphoretic.     Comments: Well-appearing and in no distress   HENT:     Head: Normocephalic and atraumatic.     Mouth/Throat:     Mouth: Oropharynx is clear and moist.     Comments: Slightly dry Eyes:     General:        Right eye:  No discharge.        Left eye: No discharge.     Extraocular Movements: EOM normal.  Cardiovascular:     Rate and Rhythm: Normal rate and regular rhythm.     Pulses: Normal pulses and intact distal pulses.     Heart sounds: Normal heart sounds. No murmur heard. No gallop.   Pulmonary:     Effort: Pulmonary effort is normal. No respiratory distress.     Breath sounds: Normal breath sounds. No wheezing or rales.     Comments: Respirations equal and unlabored, patient able to speak in full sentences, lungs clear to auscultation bilaterally  Abdominal:     General: Bowel sounds are normal. There is no distension.     Palpations: Abdomen is soft. There is no mass.     Tenderness: There is  abdominal tenderness. There is no guarding.     Comments: Abdomen is soft, nondistended, bowel sounds present and hyperactive throughout, there is some mild tenderness in the left lower quadrant, all other quadrants nontender, no guarding or peritoneal signs.  Musculoskeletal:        General: No deformity or edema.     Cervical back: Neck supple.  Skin:    General: Skin is warm and dry.     Capillary Refill: Capillary refill takes less than 2 seconds.  Neurological:     Mental Status: He is alert.     Coordination: Coordination normal.     Comments: Speech is clear, able to follow commands Moves extremities without ataxia, coordination intact  Psychiatric:        Mood and Affect: Mood normal.        Behavior: Behavior normal.     ED Results / Procedures / Treatments   Labs (all labs ordered are listed, but only abnormal results are displayed) Labs Reviewed  COMPREHENSIVE METABOLIC PANEL - Abnormal; Notable for the following components:      Result Value   Glucose, Bld 157 (*)    Calcium 8.3 (*)    AST 115 (*)    ALT 177 (*)    All other components within normal limits  CBC WITH DIFFERENTIAL/PLATELET - Abnormal; Notable for the following components:   Platelets 144 (*)    All other components within normal limits  GASTROINTESTINAL PANEL BY PCR, STOOL (REPLACES STOOL CULTURE)  LIPASE, BLOOD  HEPATITIS PANEL, ACUTE    EKG None  Radiology No results found.  Procedures Procedures   Medications Ordered in ED Medications  sodium chloride 0.9 % bolus 1,000 mL (1,000 mLs Intravenous New Bag/Given 10/21/20 1400)  sodium chloride 0.9 % bolus 1,000 mL (1,000 mLs Intravenous New Bag/Given 10/21/20 1515)    ED Course  I have reviewed the triage vital signs and the nursing notes.  Pertinent labs & imaging results that were available during my care of the patient were reviewed by me and considered in my medical decision making (see chart for details).    MDM  Rules/Calculators/A&P                         Patient presents to the ED with complaints of 3 days of diarrhea, with fevers up to 101, mild cramping abdominal pain. Patient nontoxic appearing, in no apparent distress, vitals WNL. On exam patient with some mild left lower quadrant tenderness, no peritoneal signs. Will evaluate with labs and CT abdomen pelvis.  Low suspicion for C. difficile as patient has not been on any antibiotics,  but will check GI pathogen panel.  Will give fluids, patient denies nausea and is not reporting severe pain at this time we will hold off on further medications.  Ddx: Viral gastroenteritis, colitis, diverticulitis, foodborne illness, inflammatory bowel disease  I have independently ordered, reviewed and interpreted all labs and imaging:  CBC: No leukocytosis, normal hemoglobin CMP: Glucose of 157, no other significant electrolyte derangements, normal renal function, patient does have mild transaminitis with AST of 115 and ALT of 177, no focal right upper quadrant tenderness, could be in the setting of viral GI bug, will also send hepatitis panel. Lipase: WNL  CT pending at shift change, care signed out to resident Dr. Lanny Hurst who will follow up on CT imaging and reevaluate patient, if CT shows signs of colitis would consider antibiotics given persistent diarrhea for 3 days now without signs of improvement.  If CT is negative suspect viral etiology and patient can likely be discharged home with continued supportive treatment.  Final Clinical Impression(s) / ED Diagnoses Final diagnoses:  Diarrhea, unspecified type  LLQ abdominal pain    Rx / DC Orders ED Discharge Orders    None       Janet Berlin 10/21/20 1529    Charlesetta Shanks, MD 10/22/20 [phone]

## 2020-10-21 NOTE — ED Provider Notes (Signed)
  Physical Exam  BP (!) 97/55   Pulse 71   Temp 99.8 F (37.7 C) (Oral)   Resp 15   Ht 5\' 2"  (1.575 m)   Wt 93.4 kg   SpO2 97%   BMI 37.68 kg/m   Physical Exam Vitals and nursing note reviewed.  Constitutional:      General: He is awake. He is not in acute distress.    Appearance: Normal appearance. He is well-developed and well-groomed. He is obese. He is not ill-appearing.  HENT:     Head: Normocephalic and atraumatic.     Right Ear: External ear normal.     Left Ear: External ear normal.     Nose: Nose normal.  Eyes:     General: No scleral icterus.       Right eye: No discharge.        Left eye: No discharge.     Conjunctiva/sclera: Conjunctivae normal.  Cardiovascular:     Rate and Rhythm: Normal rate and regular rhythm.  Pulmonary:     Effort: Pulmonary effort is normal. No respiratory distress.  Abdominal:     General: Abdomen is flat. There is no distension.     Palpations: Abdomen is soft.     Tenderness: There is no abdominal tenderness. There is no guarding or rebound. Negative signs include Murphy's sign, Rovsing's sign and McBurney's sign.  Skin:    General: Skin is warm and dry.     Findings: No rash.  Neurological:     General: No focal deficit present.     Mental Status: He is alert and oriented to person, place, and time.  Psychiatric:        Mood and Affect: Mood normal.        Behavior: Behavior normal. Behavior is cooperative.     ED Course/Procedures     Procedures None  MDM  Assumed care from [facility] PA-C at 1500. Please refer to her not for full H&P and initial MDM. Briefly, patient is a 86yoM presenting with watery non-bloody diarrhea for the last 3 days with LLQ abdominal tenderness on exam. Mild transaminitis on initial blood work, but otherwise reassuring. No RUQ tenderness, but hepatitis panel ordered. GI pathogen panel ordered as well. Plan will be to follow up CT results.  CT demonstrated colonic diverticulosis without acute  diverticulitis, gas fluid levels throughout colon suggestive of diarrhea illness, and mild diffuse hepatic steatosis. On reassessment, patient resting comfortably with benign abdominal exam. Suspect presentation likely viral enteritis. COVID testing added on. Doubt SBO, diverticulitis, colitis, nephrolithiasis, UTI, pyelonephritis, appendicitis, perforated viscus, AAA, cholecystitis, pancreatitis, or other acute emergent pathology at this time. Recommend symptomatic treatment at home with aggressive hydration and close follow up with PCP to review hepatitis panel and redraw LFTs since LFTs mildly elevated today. Contact information provided in AVS for patient to establish himself with new PCP. Patient otherwise HDS and appropriate for discharge. Strict return precautions provided and discussed. Questions and concerns addressed. Patient verbalized understanding and amenable with discharge plan. Discharged in stable condition.       Christy Gentles, MD 10/22/20 Vita Barley    Noemi Chapel, MD 10/23/20 1024

## 2020-10-21 NOTE — ED Triage Notes (Signed)
Pt c/o diarrhea and fever x3 days. Pt denies abd pain, n/v, decreased appetite.

## 2021-03-05 ENCOUNTER — Emergency Department (HOSPITAL_COMMUNITY)
Admission: EM | Admit: 2021-03-05 | Discharge: 2021-03-05 | Disposition: A | Discharge status: Home / Self Care | Payer: BC Managed Care – PPO | Attending: Emergency Medicine | Admitting: Emergency Medicine

## 2021-03-05 ENCOUNTER — Encounter (HOSPITAL_COMMUNITY): Payer: Self-pay | Admitting: *Deleted

## 2021-03-05 DIAGNOSIS — R739 Hyperglycemia, unspecified: Secondary | ICD-10-CM | POA: Insufficient documentation

## 2021-03-05 DIAGNOSIS — R42 Dizziness and giddiness: Secondary | ICD-10-CM | POA: Diagnosis not present

## 2021-03-05 DIAGNOSIS — E1165 Type 2 diabetes mellitus with hyperglycemia: Secondary | ICD-10-CM | POA: Diagnosis not present

## 2021-03-05 DIAGNOSIS — R5383 Other fatigue: Secondary | ICD-10-CM | POA: Diagnosis not present

## 2021-03-05 LAB — CBC WITH DIFFERENTIAL/PLATELET
Abs Immature Granulocytes: 0 10*3/uL (ref 0.00–0.07)
Basophils Absolute: 0.1 10*3/uL (ref 0.0–0.1)
Basophils Relative: 1 %
Eosinophils Absolute: 0.1 10*3/uL (ref 0.0–0.5)
Eosinophils Relative: 3 %
HCT: 43.1 % (ref 39.0–52.0)
Hemoglobin: 15.4 g/dL (ref 13.0–17.0)
Immature Granulocytes: 0 %
Lymphocytes Relative: 48 %
Lymphs Abs: 2.7 10*3/uL (ref 0.7–4.0)
MCH: 33.2 pg (ref 26.0–34.0)
MCHC: 35.7 g/dL (ref 30.0–36.0)
MCV: 92.9 fL (ref 80.0–100.0)
Monocytes Absolute: 0.3 10*3/uL (ref 0.1–1.0)
Monocytes Relative: 5 %
Neutro Abs: 2.5 10*3/uL (ref 1.7–7.7)
Neutrophils Relative %: 43 %
Platelets: 208 10*3/uL (ref 150–400)
RBC: 4.64 MIL/uL (ref 4.22–5.81)
RDW: 12.9 % (ref 11.5–15.5)
WBC: 5.7 10*3/uL (ref 4.0–10.5)
nRBC: 0 % (ref 0.0–0.2)

## 2021-03-05 LAB — URINALYSIS, ROUTINE W REFLEX MICROSCOPIC
Bacteria, UA: NONE SEEN
Bilirubin Urine: NEGATIVE
Glucose, UA: >=500 mg/dL — AB
Hgb urine dipstick: NEGATIVE
Ketones, ur: NEGATIVE mg/dL
Leukocytes,Ua: NEGATIVE
Nitrite: NEGATIVE
Protein, ur: NEGATIVE mg/dL
Specific Gravity, Urine: 1.029 (ref 1.005–1.030)
pH: 6 (ref 5.0–8.0)

## 2021-03-05 LAB — COMPREHENSIVE METABOLIC PANEL
ALT: 247 U/L — ABNORMAL HIGH (ref 0–44)
AST: 191 U/L — ABNORMAL HIGH (ref 15–41)
Albumin: 3.7 g/dL (ref 3.5–5.0)
Alkaline Phosphatase: 97 U/L (ref 38–126)
Anion gap: 8 (ref 5–15)
BUN: 7 mg/dL (ref 6–20)
CO2: 25 mmol/L (ref 22–32)
Calcium: 8.9 mg/dL (ref 8.9–10.3)
Chloride: 103 mmol/L (ref 98–111)
Creatinine, Ser: 0.77 mg/dL (ref 0.61–1.24)
GFR, Estimated: >60 mL/min (ref >60)
Glucose, Bld: 332 mg/dL — ABNORMAL HIGH (ref 70–99)
Potassium: 3.9 mmol/L (ref 3.5–5.1)
Sodium: 136 mmol/L (ref 135–145)
Total Bilirubin: 1.2 mg/dL (ref 0.3–1.2)
Total Protein: 7 g/dL (ref 6.5–8.1)

## 2021-03-05 LAB — CBG MONITORING, ED: Glucose-Capillary: 317 mg/dL — ABNORMAL HIGH (ref 70–99)

## 2021-03-05 MED ORDER — SODIUM CHLORIDE 0.9 % IV BOLUS
1000.0000 mL | Freq: Once | INTRAVENOUS | Status: AC
  Administered 2021-03-05: 1000 mL via INTRAVENOUS

## 2021-03-05 NOTE — ED Provider Notes (Signed)
Emergency Medicine Provider Triage Evaluation Note  GEORGIA BARIA , a 40 y.o. male  was evaluated in triage.  Pt complains of dizziness.  Symptoms began yesterday, self resolved currently.  No associated leg, paresthesias, weakness, facial droop, chest pain, shortness of breath.  No visual field changes.  Review of Systems  Positive: Dizziness Negative: HA, numbness, CP Shortness of breath  Physical Exam  BP 123/81   Pulse 72   Temp 98.2 F (36.8 C) (Oral)   Resp 14   SpO2 95%  Gen:   Awake, no distress   Resp:  Normal effort  MSK:   Moves extremities without difficulty  Other:  CN 2-12 grossly intact. Equal hand grip, intact sensation bilaterally  Medical Decision Making  Medically screening exam initiated at 8:28 AM.  Appropriate orders placed.  Nuchem Grattan Kannan was informed that the remainder of the evaluation will be completed by another provider, this initial triage assessment does not replace that evaluation, and the importance of remaining in the ED until their evaluation is complete.  Dizziness  VS stable   Anas Reister A, PA-C 03/05/21 0830    Lajean Saver, MD 03/06/21 [phone]

## 2021-03-05 NOTE — ED Provider Notes (Signed)
[facility] EMERGENCY DEPARTMENT Provider Note   CSN: [id number] Arrival date & time: 03/05/21  0539     History Chief Complaint  Patient presents with   Dizziness    Iosefa ERIS BRECK is a 40 y.o. male.  Patient is a 40 year old male with a history of prior AML in remission who presents with generalized fatigue.  He has had some intermittent dizziness over the last week.  He denies any spinning sensation.  He says he has been urinating frequently and he says his water tastes like sugar.  He does have a family history of diabetes but he has no known history of diabetes.  He denies any chest pain.  No palpitations.  No shortness of breath.  No nausea vomiting or diarrhea.      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 Authorizing Provider  ibuprofen (ADVIL) 200 MG tablet Take 400 mg by mouth every 6 (six) hours as needed for mild pain.   Yes [provider]    Allergies    Shrimp (diagnostic), Cefepime, Ceftazidime, Cytarabine, and Vancomycin  Review of Systems   Review of Systems  Constitutional:  Positive for fatigue. Negative for chills, diaphoresis and fever.  HENT:  Negative for congestion, rhinorrhea and sneezing.   Eyes: Negative.   Respiratory:  Negative for cough, chest tightness and shortness of breath.   Cardiovascular:  Negative for  chest pain and leg swelling.  Gastrointestinal:  Negative for abdominal pain, blood in stool, diarrhea, nausea and vomiting.  Genitourinary:  Positive for frequency. Negative for difficulty urinating, flank pain and hematuria.  Musculoskeletal:  Negative for arthralgias and back pain.  Skin:  Negative for rash.  Neurological:  Positive for dizziness and light-headedness. Negative for speech difficulty, weakness, numbness and headaches.   Physical Exam Updated Vital Signs BP 111/77   Pulse (!) 50   Temp 98.2 F (36.8 C) (Oral)   Resp 16   SpO2 100%   Physical Exam Constitutional:      Appearance: He is well-developed.  HENT:     Head: Normocephalic and atraumatic.  Eyes:     Pupils: Pupils are equal, round, and reactive to light.  Cardiovascular:     Rate and Rhythm: Normal rate and regular rhythm.     Heart sounds: Normal heart sounds.  Pulmonary:     Effort: Pulmonary effort is normal. No respiratory distress.     Breath sounds: Normal breath sounds. No wheezing or rales.  Chest:     Chest wall: No tenderness.  Abdominal:     General: Bowel sounds are normal.     Palpations: Abdomen is soft.     Tenderness: There is no abdominal tenderness. There is no guarding or rebound.  Musculoskeletal:        General: Normal range of motion.     Cervical back: Normal range of motion and neck supple.  Lymphadenopathy:     Cervical: No cervical adenopathy.  Skin:    General: Skin is warm and dry.     Findings: No rash.  Neurological:     Mental Status: He is alert and oriented to person, place, and time.     Comments: Motor 5/5 all extremities Sensation grossly intact to LT all extremities Finger to Nose intact, no pronator drift CN II-XII grossly intact Gait normal     ED Results / Procedures / Treatments   Labs (all labs ordered are listed, but only abnormal results are displayed) Labs Reviewed  COMPREHENSIVE METABOLIC PANEL - Abnormal; Notable for the following  components:      Result Value   Glucose, Bld 332 (*)    AST 191 (*)    ALT 247 (*)    All other components within normal limits  URINALYSIS, ROUTINE W REFLEX MICROSCOPIC - Abnormal; Notable for the following components:   Color, Urine STRAW (*)    Glucose, UA >=500 (*)    All other components within normal limits  CBC WITH DIFFERENTIAL/PLATELET    EKG EKG Interpretation  Date/Time:  Tuesday March 05 2021 08:24:18 EDT Ventricular Rate:  79 PR Interval:  160 QRS Duration: 76 QT Interval:  368 QTC Calculation: 421 R Axis:   55 Text Interpretation: Normal sinus rhythm Abnormal QRS-T angle, consider primary T wave abnormality Abnormal ECG No old tracing to compare Confirmed by Malvin Johns [phone]) on 03/05/2021 3:34:35 PM  Radiology No results found.  Procedures Procedures   Medications Ordered in ED Medications  sodium chloride 0.9 % bolus 1,000 mL (1,000 mLs Intravenous New Bag/Given 03/05/21 1610)    ED Course  I have reviewed the triage vital signs and the nursing notes.  Pertinent labs & imaging results that were available during my care of the patient were reviewed by me and considered in my medical decision making (see chart for details).    MDM Rules/Calculators/A&P                          Patient is a 40 year old male who presents with dizziness and fatigue.  He is noted to have hyperglycemia.  There is no evidence of DKA.  He actually feels better today than he has.  His glucose is in the 300 range.  He was given IV fluids.  We were able to make an appointment tomorrow with the PCP.  He has some mild elevation in his LFTs which is stable since February.  I discussed the need for follow-up regarding this.  He did have a hepatitis panel that was negative.  He denies any heavy alcohol use.  He denies any acetaminophen use.  However given this, I was hesitant to start him on metformin.  We will have him follow-up tomorrow for medication management and education regarding  his diabetes.  Return precautions were given. Final Clinical Impression(s) / ED Diagnoses Final diagnoses:  Hyperglycemia    Rx / DC Orders ED Discharge Orders     None        Malvin Johns, MD 03/05/21 1727

## 2021-03-05 NOTE — ED Triage Notes (Signed)
To ED for eval of dizziness intermittently over the past few days, most recently yesterday. No dizziness now. States he feels 'good' right now. Pt is in remission from leukemia - 11yrs remission and does follow up with oncology. Denies pain. States he was relaxing when dizziness starts.

## 2021-03-05 NOTE — Discharge Instructions (Addendum)
Follow-up with the primary care doctor tomorrow as discussed.  Return here as needed for any worsening symptoms.

## 2021-03-05 NOTE — ED Notes (Signed)
RN reviewed discharge instructions w/ pt. Follow up care reviewed, pt had no further questions

## 2021-03-06 ENCOUNTER — Ambulatory Visit (INDEPENDENT_AMBULATORY_CARE_PROVIDER_SITE_OTHER): Payer: BC Managed Care – PPO | Admitting: Nurse Practitioner

## 2021-03-06 ENCOUNTER — Encounter: Payer: Self-pay | Admitting: Nurse Practitioner

## 2021-03-06 ENCOUNTER — Other Ambulatory Visit: Payer: Self-pay

## 2021-03-06 VITALS — BP 109/66 | HR 97 | Temp 97.3°F | Ht 63.0 in | Wt 199.0 lb

## 2021-03-06 DIAGNOSIS — C9201 Acute myeloblastic leukemia, in remission: Secondary | ICD-10-CM

## 2021-03-06 DIAGNOSIS — Z131 Encounter for screening for diabetes mellitus: Secondary | ICD-10-CM | POA: Diagnosis not present

## 2021-03-06 DIAGNOSIS — E119 Type 2 diabetes mellitus without complications: Secondary | ICD-10-CM

## 2021-03-06 DIAGNOSIS — Z9481 Bone marrow transplant status: Secondary | ICD-10-CM

## 2021-03-06 DIAGNOSIS — Z7689 Persons encountering health services in other specified circumstances: Secondary | ICD-10-CM

## 2021-03-06 DIAGNOSIS — Z23 Encounter for immunization: Secondary | ICD-10-CM

## 2021-03-06 DIAGNOSIS — D89813 Graft-versus-host disease, unspecified: Secondary | ICD-10-CM

## 2021-03-06 LAB — POCT GLYCOSYLATED HEMOGLOBIN (HGB A1C)
HbA1c POC (<> result, manual entry): 12.7 % (ref 4.0–5.6)
HbA1c, POC (controlled diabetic range): 12.7 % — AB (ref 0.0–7.0)
HbA1c, POC (prediabetic range): 12.7 % — AB (ref 5.7–6.4)
Hemoglobin A1C: 12.7 % — AB (ref 4.0–5.6)

## 2021-03-06 LAB — POCT URINALYSIS DIPSTICK
Bilirubin, UA: NEGATIVE
Blood, UA: NEGATIVE
Glucose, UA: POSITIVE — AB
Ketones, UA: NEGATIVE
Leukocytes, UA: NEGATIVE
Nitrite, UA: NEGATIVE
Protein, UA: NEGATIVE
Spec Grav, UA: 1.01 (ref 1.010–1.025)
Urobilinogen, UA: 0.2 E.U./dL
pH, UA: 5.5 (ref 5.0–8.0)

## 2021-03-06 LAB — GLUCOSE, POCT (MANUAL RESULT ENTRY): POC Glucose: 436 mg/dl — AB (ref 70–99)

## 2021-03-06 MED ORDER — GLUCOSE BLOOD VI STRP: ORAL_STRIP | 12 refills | Status: DC

## 2021-03-06 MED ORDER — COMFORT TOUCH INSULIN PEN NEED 32G X 6 MM MISC: 1.0000 "pen " | Freq: Two times a day (BID) | 11 refills | Status: DC

## 2021-03-06 MED ORDER — LEVEMIR FLEXTOUCH 100 UNIT/ML ~~LOC~~ SOPN: 20.0000 [IU] | PEN_INJECTOR | Freq: Two times a day (BID) | SUBCUTANEOUS | 11 refills | Status: DC

## 2021-03-06 MED ORDER — BLOOD GLUCOSE MONITOR KIT: PACK | 0 refills | Status: AC

## 2021-03-06 NOTE — Progress Notes (Signed)
[facility] [place], [place]  [address] Phone:  [phone]   Fax:  [phone]   New Patient Office Visit  Subjective:  Patient ID: Tim Calderon, male    DOB: 1981-04-10  Age: 40 y.o. MRN: [id number]  CC:  Chief Complaint  Patient presents with   New Patient (Initial Visit)    Was seen at ED yesterday for dizziness and frequent  urination. Stated he is currently in remission     HPI Tim Calderon presents to establish care. He  has a past medical history of Leukemia ([place]).   Diabetes Mellitus Patient presents with new onset of Type 2 diabetes. He went to the ED on yesterday but was not treated. Current symptoms include: hyperglycemia and polyuria. Symptoms have progressed to a point and plateaued. Patient denies foot ulcerations, hypoglycemia , nausea, paresthesia of the feet, polydipsia, visual disturbances, vomiting, and weight loss. Evaluation to date has included: hemoglobin A1C.  Home sugars: patient does not check sugars. Current treatment: none. He reports that his last alcohol intake was 2 weeks ago.   Past Medical History:  Diagnosis Date   Leukemia [facility])     Past Surgical History:  Procedure Laterality Date   CHEST SURGERY      Family History  Problem Relation Age of Onset   Diabetes Mother    Diabetes Father    Diabetes Sister    Diabetes Brother     Social History   Socioeconomic History   Marital status: Married    Spouse name: Not on file   Number of children: Not on file   Years of education: Not on file   Highest education level: Not on file  Occupational History   Not on file  Tobacco Use   Smoking status: Never   Smokeless tobacco: Never  Substance and Sexual Activity   Alcohol use: Yes    Comment: occ   Drug use: No   Sexual activity: Not on file  Other Topics Concern   Not on file  Social History Narrative   Not on file   Social Determinants of Health   Financial Resource Strain: Not on  file  Food Insecurity: Not on file  Transportation Needs: Not on file  Physical Activity: Not on file  Stress: Not on file  Social Connections: Not on file  Intimate Partner Violence: Not on file    ROS Review of Systems  Objective:   Today's Vitals: BP 109/66 (BP Location: Right Arm, Patient Position: Sitting)   Pulse 97   Temp (!) 97.3 F (36.3 C)   Ht '5\' 3"'  (1.6 m)   Wt 199 lb 0.2 oz (90.3 kg)   SpO2 96%   BMI 35.25 kg/m   Physical Exam Constitutional:      Appearance: He is obese.  HENT:     Head: Normocephalic and atraumatic.     Right Ear: Tympanic membrane normal.  Cardiovascular:     Rate and Rhythm: Normal rate and regular rhythm.     Pulses: Normal pulses.     Heart sounds: Normal heart sounds.  Pulmonary:     Effort: Pulmonary effort is normal.     Breath sounds: Normal breath sounds.  Abdominal:     General: Bowel sounds are normal.     Palpations: Abdomen is soft.  Musculoskeletal:     Cervical back: Normal range of motion.  Skin:    General: Skin is warm and dry.  Capillary Refill: Capillary refill takes less than 2 seconds.  Neurological:     General: No focal deficit present.     Mental Status: He is alert and oriented to person, place, and time.  Psychiatric:        Mood and Affect: Mood normal.        Behavior: Behavior normal.        Thought Content: Thought content normal.        Judgment: Judgment normal.    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 Discussed prostate age related concerns Discussed general safety in vehicle and COVID Discussed regular hydration with water Discussed  healthy diet and exercise and weight management Discussed sexual health  Discussed mental health Encouraged to call our office for an appointment with in ongoing concerns for questions.      Relevant Orders   Urinalysis Dipstick (Completed)   Screening for diabetes mellitus       Relevant Orders   HgB A1c (Completed)   Glucose (CBG) (Completed)   Microalbumin, urine (Completed)   New onset type 2 diabetes mellitus ([place])     Initiated Levemir 20 units twice daily  Encourage regular CBG monitoring Encourage contacting office if excessive hyperglycemia and or hypoglycemia Lifestyle modification with healthy diet (fewer calories, more high fiber foods, whole grains and non-starchy vegetables, lower fat meat and fish, low-fat diary include healthy oils) regular exercise (physical activity) and weight loss Opthalmology exam discussed  Nutritional consult recommended Regular dental visits encouraged Home BP monitoring also encouraged goal <130/80    Relevant Medications   insulin detemir (LEVEMIR FLEXTOUCH) 100 UNIT/ML FlexPen   Other Relevant Orders   Microalbumin, urine (Completed)       Outpatient Encounter Medications as of 03/06/2021  Medication Sig   blood glucose meter kit and supplies KIT Dispense based on patient and insurance preference. Use up to four times daily as directed.   glucose blood test strip Use as instructed   insulin detemir (LEVEMIR FLEXTOUCH) 100 UNIT/ML FlexPen Inject 20 Units into the skin 2 (two) times daily.   Insulin Pen Needle (COMFORT TOUCH INSULIN PEN NEED) 32G X 6 MM MISC 1 pen by Does not apply route in the morning and at bedtime.   [DISCONTINUED] ibuprofen (ADVIL) 200 MG tablet Take 400 mg by mouth every 6 (six) hours as needed for mild pain.   No facility-administered encounter medications on file as of 03/06/2021.    Follow-up: Return in about 4 weeks (around 04/03/2021) for am apt fasting labs, follow up [id number].   Vevelyn Francois, NP

## 2021-03-06 NOTE — Patient Instructions (Signed)
Mantenimiento de Teacher, English as a foreign language, Male Adoptar un estilo de vida saludable y recibir atencin preventiva son importantes para promover la salud y Musician. Consulte al mdico sobre: El esquema adecuado para hacerse pruebas y exmenes peridicos. Cosas que puede hacer por su cuenta para prevenir enfermedades y [place] sano. Qu debo saber sobre la dieta, el peso y el ejercicio? Consuma una dieta saludable  Consuma una dieta que incluya muchas verduras, frutas, productos lcteos con bajo contenido de [place] y Advertising account planner. No consuma muchos alimentos ricos en grasas slidas, azcares agregados o sodio.  Mantenga un peso saludable El ndice de masa muscular [facility]) es una medida que puede utilizarse para identificar posibles problemas de [place]. Proporciona una estimacin de la grasa corporal basndose en el peso y la altura. Su mdico puede ayudarle Soil scientist Mineral y a Scientist, forensic o Theatre manager un peso saludable. Haga ejercicio con regularidad Haga ejercicio con regularidad. Esta es una de las prcticas ms importantes que puede hacer por su salud. La [employer] de los adultos deben seguir estas pautas: Optometrist, al menos, 150 minutos de actividad fsica por semana. El ejercicio debe aumentar la frecuencia cardaca y Nature conservation officer transpirar (ejercicio de intensidad moderada). Hacer ejercicios de fortalecimiento por lo [employer] por semana. Agregue esto a su plan de ejercicio de intensidad moderada. Pasar menos tiempo sentados. Incluso la actividad fsica ligera puede ser beneficiosa. Controle sus niveles de colesterol y lpidos en la sangre Comience a realizarse anlisis de lpidos y colesterol en la sangre a los20 aos y luego reptalos cada 5 aos. Es posible que Automotive engineer los niveles de colesterol con mayor frecuencia si: Sus niveles de lpidos y colesterol son altos. Es mayor de 23 aos. Presenta un alto riesgo de padecer enfermedades cardacas. Qu debo  saber sobre las pruebas de deteccin del cncer? Muchos tipos de cncer pueden detectarse de manera temprana y, a menudo, pueden prevenirse. Segn su historia clnica y sus antecedentes familiares, es posible que deba realizarse pruebas de deteccin del cncer en diferentes edades. Esto puede incluir pruebas de deteccin de lo siguiente: Surveyor, minerals. Cncer de prstata. Cncer de piel. Cncer de pulmn. Qu debo saber sobre la enfermedad cardaca, la diabetes y la hipertensinarterial? Presin arterial y enfermedad cardaca La hipertensin arterial causa enfermedades cardacas y [place] el riesgo de accidente cerebrovascular. Es ms probable que esto se manifieste en las personas que tienen lecturas de presin arterial alta, tienen ascendencia africana o tienen sobrepeso. Hable con el mdico sobre sus valores de presin arterial deseados. Hgase controlar la presin arterial: Cada 3 a 5 aos si tiene entre 18 y 48 aos. Todos los aos si es mayor de 40 aos. Si tiene entre 16 y 23 aos y es fumador o Insurance account manager, pregntele al mdico si debe realizarse una prueba de deteccin de aneurisma artico abdominal (AAA) por nica vez. Diabetes Realcese exmenes de deteccin de la diabetes con regularidad. Este anlisis revisa el nivel de azcar en la sangre en [place]. Hgase las pruebas de deteccin: Cada tres aos despus de los 74 aos de edad si tiene un peso normal y un bajo riesgo de padecer diabetes. Con ms frecuencia y a partir de [place] edad inferior si tiene sobrepeso o un alto riesgo de padecer diabetes. Qu debo saber sobre la prevencin de infecciones? Hepatitis B Si tiene un riesgo ms alto de contraer hepatitis B, debe someterse a un examen de deteccin de este virus. Hable con el mdico para averiguar si tiene riesgode  contraer la infeccin por hepatitis B. Hepatitis C Se recomienda un anlisis de [place] para: Todos los que nacieron entre 1946 y [phone]. Todas las personas que  tengan un riesgo de haber contrado hepatitis C. Enfermedades de transmisin sexual (ETS) Debe realizarse pruebas de deteccin de ITS todos los aos, incluidas la gonorrea y la clamidia, si: Es sexualmente activo y es menor de 77 aos. Es mayor de 61 aos, y Investment banker, operational informa que corre riesgo de tener este tipo de infecciones. La actividad sexual ha cambiado desde que le hicieron la ltima prueba de deteccin y tiene un riesgo mayor de Best boy clamidia o Radio broadcast assistant. Pregntele al mdico si usted tiene riesgo. Pregntele al mdico si usted tiene un alto riesgo de Museum/gallery curator VIH. El mdico tambin puede recomendarle un medicamento recetado para ayudar a evitar la infeccin por el VIH. Si elige tomar medicamentos para prevenir el VIH, primero debe [employer] de deteccin del VIH. Luego debe hacerse anlisis cada 3 meses mientras est tomando los medicamentos. Siga estas instrucciones en su casa: Estilo de vida No consuma ningn producto que contenga nicotina o tabaco, como cigarrillos, cigarrillos electrnicos y tabaco de Higher education careers adviser. Si necesita ayuda para dejar de fumar, consulte al mdico. No consuma drogas. No comparta agujas. Solicite ayuda a su mdico si necesita apoyo o informacin para abandonar las drogas. Consumo de alcohol No beba alcohol si el mdico se lo prohbe. Si bebe alcohol: Limite la cantidad que consume de 0 a 2 medidas por da. Est atento a la cantidad de alcohol que hay en las bebidas que toma. En los [place], una medida equivale a una botella de cerveza de 12 oz (355 ml), un vaso de vino de 5 oz (148 ml) o un vaso de una bebida alcohlica de alta graduacin de 1 oz (44 ml). Instrucciones generales Realcese los estudios de rutina de la salud, dentales y de Public librarian. [place]. Infrmele a su mdico si: Se siente deprimido con frecuencia. Alguna vez ha sido vctima de [place] o no se siente seguro en su casa. Resumen Adoptar un estilo  de vida saludable y recibir atencin preventiva son importantes para promover la salud y Musician. Siga las instrucciones del mdico acerca de una dieta saludable, el ejercicio y la realizacin de pruebas o exmenes para Engineer, building services. Siga las instrucciones del mdico con respecto al control del colesterol y la presin arterial. Esta informacin no tiene Marine scientist el consejo del mdico. Asegresede hacerle al mdico cualquier pregunta que tenga. Document Revised: 09/01/2018 Document Reviewed: 09/01/2018 Elsevier Patient Education  [place].  Diabetes mellitus y nutricin, en adultos Diabetes Mellitus and Nutrition, Adult Si sufre de diabetes, o diabetes mellitus, es muy importante tener hbitos alimenticios saludables debido a que sus niveles de Designer, television/film set sangre (glucosa) se ven afectados en gran medida por lo que come y bebe. Comer alimentos saludables en las cantidades correctas, aproximadamente a la misma hora todos los [place], [place] ayudar a: Aeronautical engineer glucemia. Disminuir el riesgo de sufrir una enfermedad cardaca. Mejorar la presin arterial. Science writer o mantener un peso saludable. Qu puede afectar mi plan de alimentacin? Todas las personas que sufren de diabetes son diferentes y cada una tiene necesidades diferentes en cuanto a un plan de alimentacin. El mdico puede recomendarle que trabaje con un nutricionista para elaborar el mejor plan para usted. Su plan de alimentacin puede variar segn factores como: Las caloras que necesita. Los medicamentos que toma. Su  peso. Sus niveles de glucemia, presin arterial y colesterol. Su nivel de [place]. Otras afecciones que tenga, como enfermedades cardacas o renales. Cmo me afectan los carbohidratos? Los carbohidratos, o hidratos de carbono, afectan su nivel de glucemia ms que cualquier otro tipo de alimento. La ingesta de carbohidratos naturalmente aumenta la cantidad de [employer]. El  recuento de carbohidratos es un mtodo destinado a Catering manager un registro de la cantidad de carbohidratos que se consumen. El recuento de carbohidratos es importante para Theatre manager la glucemia a un nivel saludable, especialmente si utiliza insulina o toma determinadosmedicamentos por va oral para la diabetes. Es importante conocer la cantidad de carbohidratos que se pueden ingerir en cada comida sin correr Engineer, manufacturing. Esto es Psychologist, forensic. Su nutricionista puede ayudarlo a calcular la cantidad de carbohidratos que debeingerir en cada comida y en cada refrigerio. Cmo me afecta el alcohol? El alcohol puede provocar disminuciones sbitas de la glucemia (hipoglucemia), especialmente si utiliza insulina o toma determinados medicamentos por va oral para la diabetes. La hipoglucemia es una afeccin potencialmente mortal. Los sntomas de la hipoglucemia, como somnolencia, mareos y confusin, son similares a los sntomas de haber consumido demasiado alcohol. No beba alcohol si: Su mdico le indica no hacerlo. Est embarazada, puede estar embarazada o est tratando de [place]. Si bebe alcohol: No beba con el estmago vaco. Limite la cantidad que bebe: De 0 a 1 medida por da para las mujeres. De 0 a 2 medidas por da para los hombres. Est atento a la cantidad de alcohol que hay en las bebidas que toma. En los [place], una medida equivale a una botella de cerveza de 12 oz (355 ml), un vaso de vino de 5 oz (148 ml) o un vaso de una bebida alcohlica de alta graduacin de 1 oz (44 ml). Mantngase hidratado bebiendo agua, refrescos dietticos o t helado sin azcar. Tenga en cuenta que los refrescos comunes, los jugos y otras bebida para Optician, dispensing pueden contener mucha azcar y se deben contar como carbohidratos. Consejos para seguir Company secretary las etiquetas de los alimentos Comience por leer el tamao de la porcin en la "Informacin nutricional" en las etiquetas de los  alimentos envasados y las bebidas. La cantidad de caloras, carbohidratos, grasas y otros nutrientes mencionados en la etiqueta se basan en una porcin del alimento. Muchos alimentos contienen ms de una porcin por envase. Verifique la cantidad total de gramos (g) de carbohidratos totales en una porcin. Puede calcular la cantidad de porciones de carbohidratos al dividir el total de carbohidratos por 15. Por ejemplo, si un alimento tiene un total de 30 g de carbohidratos totales por porcin, equivale a 2 porciones de carbohidratos. Verifique la cantidad de gramos (g) de grasas saturadas y grasas trans de una porcin. Escoja alimentos que no contengan estas grasas o que su contenido de estas sea [place]. Verifique la cantidad de miligramos (mg) de sal (sodio) en una porcin. La State Farm de las personas deben limitar la ingesta de sodio total a menos de 2300 mg Honeywell. Siempre consulte la informacin nutricional de los alimentos etiquetados como "con bajo contenido de grasa" o "sin grasa". Estos alimentos pueden tener un mayor contenido de Location manager agregada o carbohidratos refinados, y deben evitarse. Hable con su nutricionista para identificar sus objetivos diarios en cuanto a los nutrientes mencionados en la etiqueta. Al ir de compras Evite comprar alimentos procesados, enlatados o precocidos. Estos alimentos tienden a Special educational needs teacher mayor cantidad de [place], [place]  y azcar agregada. Compre en la zona exterior de la tienda de comestibles. Esta es la zona donde se encuentran con mayor frecuencia las frutas y las verduras frescas, los cereales a granel, las carnes frescas y los productos lcteos frescos. Al cocinar Utilice mtodos de coccin a baja temperatura, como hornear, en lugar de mtodos de coccin a alta temperatura, como frer en abundante aceite. Cocine con aceites saludables, como el aceite de [place], canola o [place]. Evite cocinar con manteca, crema o carnes con alto contenido de grasa. Planificacin de  las comidas Coma las comidas y los refrigerios regularmente, preferentemente a la misma hora todos [place]. Evite pasar largos perodos de tiempo sin comer. Consuma alimentos ricos en fibra, como frutas frescas, verduras, frijoles y cereales integrales. Consulte a su nutricionista sobre cuntas porciones de carbohidratos puede consumir en cada comida. Consuma entre 4 y 6 onzas (entre 112 y 168 g) de protenas magras por da, como carnes [place], pollo, pescado, huevos o tofu. Una onza (oz) de protena magra equivale a: 1 onza (28 g) de carne, pollo o pescado. 1 huevo.  de taza (62 g) de tofu. Coma algunos alimentos por da que contengan grasas saludables, como aguacates, frutos secos, semillas y pescado. Qu alimentos debo comer? Lambert Mody Bayas. Manzanas. Naranjas. Duraznos. Damascos. Ciruelas. Uvas. Mango. Papaya.[place]. Kiwi. Cerezas. [place] Valeda Malm. Espinaca. Verduras de Boeing, que incluyen col rizada, [place], hojas de [place] y de [place]. Remolachas. Coliflor. Repollo. Brcoli. Zanahorias. Judas verdes. Tomates. Pimientos. Cebollas. Pepinos. Coles deBruselas. Granos Granos integrales, como panes, galletas, tortillas, cereales y pastas desalvado o integrales. Avena sin azcar. Quinua. Arroz integral o salvaje. Carnes y Psychiatric nurse. Carne de ave sin piel. Cortes magros de ave y carne de res. Tofu.Frutos secos. Semillas. Lcteos Productos lcteos sin grasa o con bajo contenido de [place], [place]ilda, yogur [place]. Es posible que los productos que se enumeran ms [place] no constituyan una lista completa de los alimentos y las bebidas que puede tomar. Consulte a un nutricionista para obtener ms informacin. Qu alimentos debo evitar? Lambert Mody Frutas enlatadas al almbar. Verduras Verduras enlatadas. Verduras congeladas con mantequilla o salsa de crema. Granos Productos elaborados con [place] y [place], como panes, pastas,bocadillos y cereales. Evite todos los  alimentos procesados. Carnes y otras protenas Cortes de carne con alto contenido de Lobbyist. Carne de ave con piel. Carnesempanizadas o fritas. Carne procesada. Evite las grasas saturadas. Lcteos Yogur, [place] enteros. Bebidas Bebidas azucaradas, como gaseosas o t helado. Es posible que los productos que se enumeran ms [place] no constituyan una lista completa de los alimentos y las bebidas que Nurse, adult. Consulte a un nutricionista para obtener ms informacin. Preguntas para hacerle al mdico Es necesario que me rena con Radio broadcast assistant en el cuidado de la diabetes? Es necesario que me rena con un nutricionista? A qu nmero puedo llamar si tengo preguntas? Cules son los mejores momentos para controlar la glucemia? Dnde encontrar ms informacin: Asociacin Estadounidense de la Diabetes (American Diabetes Association): diabetes.org Academy of Nutrition and Dietetics (Academia de Nutricin y Information systems manager): www.eatright.Unisys Corporation of Diabetes and Digestive and Kidney Diseases ([place] la Diabetes y [place] y [place]): DesMoinesFuneral.dk Association of Diabetes Care and Education Specialists (Asociacin de Especialistas en Atencin y Educacin sobre la Diabetes): www.diabeteseducator.org Resumen Es importante tener hbitos alimenticios saludables debido a que sus niveles de Designer, television/film set sangre (glucosa) se ven afectados en gran medida por lo que come y bebe. Un plan de alimentacin  saludable lo ayudar a controlar la glucemia y Theatre manager un estilo de vida saludable. El mdico puede recomendarle que trabaje con un nutricionista para elaborar el mejor plan para usted. Tenga en cuenta que los carbohidratos (hidratos de carbono) y el alcohol tienen efectos inmediatos en sus niveles de glucemia. Es importante contar los carbohidratos que ingiere y consumir alcohol con prudencia. Esta informacin no tiene Marine scientist el consejo del  mdico. Asegresede hacerle al mdico cualquier pregunta que tenga. Document Revised: 09/15/2019 Document Reviewed: 09/15/2019 Elsevier Patient Education  2021 [place] bsicos de la diabetes mellitus Diabetes Mellitus Basics  La diabetes mellitus, o (diabetes) es una enfermedad prolongada (crnica). Se produce cuando el cuerpo no utiliza Occupational hygienist (glucosa) que se libera de los alimentos despus de comer. La diabetes mellitus puede deberse a uno de Mirant o a ambos: El pncreas no produce suficiente cantidad de una hormona llamada insulina. El cuerpo no reacciona de forma normal a la insulina que produce. La insulina permite que la glucosa ingrese a las clulas del cuerpo. Esto le proporciona energa. Si tiene diabetes, la glucosa no puede ingresar a las clulas. Esto produce un aumento de la glucemia (hiperglucemia). Cmo tratar y Chief Technology Officer la diabetes Es posible que tenga que administrarse insulina u otros medicamentos para la diabetes todos los [place] para mantener el nivel de glucosa en la sangre equilibrado. Si le recetan insulina, aprender cmo aplicrsela con inyecciones. Es posible que deba ajustar la cantidad de insulina en funcin delos alimentos que coma. Tendr que controlarse los niveles de glucemia con un monitor de glucosa como se lo haya indicado el mdico. Las lecturas pueden ayudar a Teacher, adult education si tieneun nivel bajo o alto de glucemia. Generalmente, los resultados de los niveles de glucemia deben ser los siguientes: Antes de las comidas (preprandial): de 80 a 130 mg/dl (de 4,4 a 7,2 mmol/l). Despus de las comidas (posprandial): por debajo de 180 mg/dl (10 mmol/l). Nivel de hemoglobina A1c (HbA1c): menos del 7 %. El mdico fijar objetivos de tratamiento para usted. Cumpla con todas las visitas de seguimiento. Esto es importante. Siga estas instrucciones en su casa: Medicamentos para la diabetes Use los medicamentos para la diabetes  como se lo haya indicado el mdico. Haga una lista de los medicamentos para la diabetes aqu: Nombre del medicamento: ______________________________ [employer] Inc (dosis): ________________ Mellody Drown (a. m./p. m.): _______________ Wilford Grist: ___________________________________ Valrie Hart del medicamento: ______________________________ Cantidad (dosis): ________________ Mellody Drown (a. m./p. m.): _______________ Notas: ___________________________________ Valrie Hart del medicamento: ______________________________ Cantidad (dosis): ________________ Mellody Drown (a. m./p. m.): _______________ Notas: ___________________________________ Missy Sabins [place] insulina, haga una lista de los tipos de [place] aqu: Tipo de insulina: ______________________________ Cantidad (dosis): ________________ Mellody Drown (a. m./p. m.): _______________Notas: ___________________________________ Suezanne Jacquet: ______________________________ Cantidad (dosis): ________________ Mellody Drown (a. m./p. m.): _______________ Notas: ___________________________________ Suezanne Jacquet: ______________________________ Cantidad (dosis): ________________ Mellody Drown (a. m./p. m.): _______________ Notas: ___________________________________ Suezanne Jacquet: ______________________________ Cantidad (dosis): ________________ Mellody Drown (a. m./p. m.): _______________ Notas: ___________________________________ Suezanne Jacquet: ______________________________ Cantidad (dosis): ________________ Mellody Drown (a. m./p. m.): _______________ Notas: ___________________________________ Cmo controlar la glucemia  Contrlese los niveles de glucemia con un monitor de glucosa como se lo hayaindicado el mdico. Anote las veces que se controla los [place] de glucosa: Hora: _______________ Wilford Grist: ___________________________________ Hora: _______________ Notas: ___________________________________ Hora: _______________ Notas: ___________________________________ Hora: _______________ Notas:  ___________________________________ Hora: _______________ Notas: ___________________________________ Hora: _______________ Notas: ___________________________________  Rosalyn Charters de glucemia El nivel bajo de glucemia (hipoglucemia) se produce cuando la glucosa es igual o menor que 70  mg/dl (3.9 mmol/l). Entre los sntomas, se pueden incluir los siguientes: Sentir: Ethete. Sudoracin y piel hmeda. Irritabilidad o enfado con facilidad. Mareos. Somnolencia. Tener: Latidos cardacos acelerados. Dolor de [place]. Cambios en la visin. Hormigueo o adormecimiento alrededor de la boca, labios o lengua. Dificultades para hacer lo siguiente: Moverse (coordinacin). Dormir. Cmo tratar un nivel bajo de glucemia Para tratar un nivel bajo de glucemia, ingiera un alimento o una bebida azucarada de inmediato. Si puede pensar con claridad y tragar de manera segura, siga la regla 15/15, que consiste en lo siguiente: Tome 15 gramos de un hidrato de carbono (carbohidrato) de accin rpida, como le indic su mdico. Algunos hidratos de carbono de accin rpida son: Comprimidos de glucosa: tome 3 o 4 comprimidos. Caramelos duros: coma 3 a 5 unidades. Jugo de fruta: beba 4 onzas (120 ml). Refresco comn (no diettico): beba de 4 a 6 onzas (de 120 a 180 ml). Miel o azcar: coma 1 cucharada (15 ml). Verifique sus niveles de glucemia 15 minutes despus de ingerir el hidrato de carbono. Si la glucosa todava es igual o menor que 70 mg/dl (3.9 mmol/l), ingiera nuevamente 15 gramos de un hidrato de carbono. Si la glucosa no supera los 70 mg/dl (3.9 mmol/l) despus de 3 intentos, solicite ayuda de inmediato. Ingiera una comida o una colacin en el transcurso de 1 hora despus de que la glucosa se haya normalizado. Cmo tratar un nivel muy bajo de glucemia Si la glucosa es igual o menor que 54 mg/dl (3 mmol/l), significa que su nivel de glucemia est muy bajo (hipoglucemia grave). Esto es Engineer, maintenance (IT). No  espere a ver si los sntomas desaparecen. Solicite atencin mdica de inmediato. Comunquese con el servicio de emergencias de su localidad (911 en los [place]). No conduzca por sus propios medios [employer] hospital. Preguntas para hacerle al mdico Es necesario que converse con un educador en el cuidado de la diabetes? Qu equipos necesitar para cuidarme en casa? Qu medicamentos para la diabetes necesito? Cundo debo tomarlos? Con qu frecuencia debo comprobar mis niveles de glucemia? A qu nmero puedo llamar si tengo preguntas? Cundo es mi visita de seguimiento? Dnde puedo encontrar un grupo de apoyo para las personas con diabetes? Dnde buscar ms informacin American Diabetes Association (Asociacin Estadounidense de la Diabetes): www.diabetes.org Association of Diabetes Care and Education Specialists (Asociacin de Especialistas en Atencin y Educacin sobre la Diabetes): www.diabeteseducator.org Comunquese con un mdico si: El nivel de glucemia es mayor o igual que 240 mg/dl (13.3 mmol/dl) durante 2 das seguidos. Ha estado enfermo o ha tenido fiebre durante 2 das o ms y no [place]. Tiene alguno de estos problemas durante ms de 6 horas: No puede comer ni beber. Siente nuseas. Vomita. Tiene diarrea. Solicite ayuda de inmediato si: Su nivel de glucemia est por debajo de 54 mg/dl (3 mmol/l). Se siente confundido. Tiene problemas para pensar con claridad. Tiene dificultad para respirar. Estos sntomas pueden representar un problema grave que constituye Engineer, maintenance (IT). No espere a ver si los sntomas desaparecen. Solicite atencin mdica de inmediato. Comunquese con el servicio de emergencias de su localidad (911 en los [place]). No conduzca por sus propios medios [employer] Financial. Resumen La diabetes mellitus es una enfermedad crnica que se produce cuando el cuerpo no utiliza Occupational hygienist (glucosa) que se libera de los alimentos despus de  comer. Aplquese la insulina y tome los medicamentos para la diabetes como se lo hayan indicado. Controle su nivel de glucemia todos [place], con  la frecuencia que le hayan indicado. Cumpla con todas las visitas de seguimiento. Esto es importante. Esta informacin no tiene Marine scientist el consejo del mdico. Asegresede hacerle al mdico cualquier pregunta que tenga. Document Revised: 02/08/2020 Document Reviewed: 02/08/2020 Elsevier Patient Education  [place].

## 2021-03-07 ENCOUNTER — Telehealth: Payer: Self-pay

## 2021-03-07 LAB — MICROALBUMIN, URINE: Microalbumin, Urine: <3 ug/mL

## 2021-03-07 NOTE — Telephone Encounter (Signed)
Tim Calderon from [facility] stated needles were out of stock but have been ordered. Glucose meter is ready for pick up. Left voicemail also advised patient to call back with any questions or concerns.

## 2021-03-07 NOTE — Telephone Encounter (Signed)
Pt called and said [facility] didn't give him the needles and device   Call pt to make sure this is all he need's please

## 2021-04-03 ENCOUNTER — Other Ambulatory Visit: Payer: BC Managed Care – PPO

## 2021-04-03 ENCOUNTER — Other Ambulatory Visit: Payer: Self-pay

## 2021-04-08 ENCOUNTER — Ambulatory Visit: Payer: BC Managed Care – PPO | Admitting: Nurse Practitioner

## 2021-04-08 ENCOUNTER — Other Ambulatory Visit: Payer: Self-pay

## 2021-04-08 ENCOUNTER — Encounter: Payer: Self-pay | Admitting: Nurse Practitioner

## 2021-04-08 VITALS — BP 100/63 | HR 74 | Temp 97.3°F | Ht 63.0 in | Wt 201.0 lb

## 2021-04-08 DIAGNOSIS — Z13220 Encounter for screening for lipoid disorders: Secondary | ICD-10-CM

## 2021-04-08 DIAGNOSIS — E1165 Type 2 diabetes mellitus with hyperglycemia: Secondary | ICD-10-CM | POA: Diagnosis not present

## 2021-04-08 LAB — GLUCOSE, POCT (MANUAL RESULT ENTRY): POC Glucose: 163 mg/dl — AB (ref 70–99)

## 2021-04-08 MED ORDER — METFORMIN HCL 1000 MG PO TABS: 1000.0000 mg | ORAL_TABLET | Freq: Two times a day (BID) | ORAL | 3 refills | Status: DC

## 2021-04-08 NOTE — Patient Instructions (Signed)
Diabetes Mellitus and Nutrition, Adult When you have diabetes, or diabetes mellitus, it is very important to have healthy eating habits because your blood sugar (glucose) levels are greatly affected by what you eat and drink. Eating healthy foods in the right amounts, at about the same times every day, can help you: Control your blood glucose. Lower your risk of heart disease. Improve your blood pressure. Reach or maintain a healthy weight. What can affect my meal plan? Every person with diabetes is different, and each person has different needs for a meal plan. Your health care provider may recommend that you work with a dietitian to make a meal plan that is best for you. Your meal plan may vary depending on factors such as: The calories you need. The medicines you take. Your weight. Your blood glucose, blood pressure, and cholesterol levels. Your activity level. Other health conditions you have, such as heart or kidney disease. How do carbohydrates affect me? Carbohydrates, also called carbs, affect your blood glucose level more than any other type of food. Eating carbs naturally raises the amount of glucose in your blood. Carb counting is a method for keeping track of how many carbs you eat. Counting carbs is important to keep your blood glucose at a healthy level,especially if you use insulin or take certain oral diabetes medicines. It is important to know how many carbs you can safely have in each meal. This is different for every person. Your dietitian can help you calculate how manycarbs you should have at each meal and for each snack. How does alcohol affect me? Alcohol can cause a sudden decrease in blood glucose (hypoglycemia), especially if you use insulin or take certain oral diabetes medicines. Hypoglycemia can be a life-threatening condition. Symptoms of hypoglycemia, such as sleepiness, dizziness, and confusion, are similar to symptoms of having too much alcohol. Do not drink  alcohol if: Your health care provider tells you not to drink. You are pregnant, may be pregnant, or are planning to become pregnant. If you drink alcohol: Do not drink on an empty stomach. Limit how much you use to: 0-1 drink a day for women. 0-2 drinks a day for men. Be aware of how much alcohol is in your drink. In the [place] one drink equals one 12 oz bottle of beer (355 mL), one 5 oz glass of wine (148 mL), or one 1 oz glass of hard liquor (44 mL). Keep yourself hydrated with water, diet soda, or unsweetened iced tea. Keep in mind that regular soda, juice, and other mixers may contain a lot of sugar and must be counted as carbs. What are tips for following this plan?  Reading food labels Start by checking the serving size on the "Nutrition Facts" label of packaged foods and drinks. The amount of calories, carbs, fats, and other nutrients listed on the label is based on one serving of the item. Many items contain more than one serving per package. Check the total grams (g) of carbs in one serving. You can calculate the number of servings of carbs in one serving by dividing the total carbs by 15. For example, if a food has 30 g of total carbs per serving, it would be equal to 2 servings of carbs. Check the number of grams (g) of saturated fats and trans fats in one serving. Choose foods that have a low amount or none of these fats. Check the number of milligrams (mg) of salt (sodium) in one serving. Most people should limit total  sodium intake to less than 2,300 mg per day. Always check the nutrition information of foods labeled as "low-fat" or "nonfat." These foods may be higher in added sugar or refined carbs and should be avoided. Talk to your dietitian to identify your daily goals for nutrients listed on the label. Shopping Avoid buying canned, pre-made, or processed foods. These foods tend to be high in fat, sodium, and added sugar. Shop around the outside edge of the grocery store. This  is where you will most often find fresh fruits and vegetables, bulk grains, fresh meats, and fresh dairy. Cooking Use low-heat cooking methods, such as baking, instead of high-heat cooking methods like deep frying. Cook using healthy oils, such as olive, canola, or sunflower oil. Avoid cooking with butter, cream, or high-fat meats. Meal planning Eat meals and snacks regularly, preferably at the same times every day. Avoid going long periods of time without eating. Eat foods that are high in fiber, such as fresh fruits, vegetables, beans, and whole grains. Talk with your dietitian about how many servings of carbs you can eat at each meal. Eat 4-6 oz (112-168 g) of lean protein each day, such as lean meat, chicken, fish, eggs, or tofu. One ounce (oz) of lean protein is equal to: 1 oz (28 g) of meat, chicken, or fish. 1 egg.  cup (62 g) of tofu. Eat some foods each day that contain healthy fats, such as avocado, nuts, seeds, and fish. What foods should I eat? Fruits Berries. Apples. Oranges. Peaches. Apricots. Plums. Grapes. Mango. Papaya.Pomegranate. Kiwi. Cherries. Vegetables Lettuce. Spinach. Leafy greens, including kale, chard, collard greens, and mustard greens. Beets. Cauliflower. Cabbage. Broccoli. Carrots. Green beans.Tomatoes. Peppers. Onions. Cucumbers. Brussels sprouts. Grains Whole grains, such as whole-wheat or whole-grain bread, crackers, tortillas,cereal, and pasta. Unsweetened oatmeal. Quinoa. Brown or wild rice. Meats and other proteins Seafood. Poultry without skin. Lean cuts of poultry and beef. Tofu. Nuts. Seeds. Dairy Low-fat or fat-free dairy products such as milk, yogurt, and cheese. The items listed above may not be a complete list of foods and beverages you can eat. Contact a dietitian for more information. What foods should I avoid? Fruits Fruits canned with syrup. Vegetables Canned vegetables. Frozen vegetables with butter or cream sauce. Grains Refined white  flour and flour products such as bread, pasta, snack foods, andcereals. Avoid all processed foods. Meats and other proteins Fatty cuts of meat. Poultry with skin. Breaded or fried meats. Processed meat.Avoid saturated fats. Dairy Full-fat yogurt, cheese, or milk. Beverages Sweetened drinks, such as soda or iced tea. The items listed above may not be a complete list of foods and beverages you should avoid. Contact a dietitian for more information. Questions to ask a health care provider Do I need to meet with a diabetes educator? Do I need to meet with a dietitian? What number can I call if I have questions? When are the best times to check my blood glucose? Where to find more information: American Diabetes Association: diabetes.org Academy of Nutrition and Dietetics: www.eatright.[employer] of Diabetes and Digestive and Kidney Diseases: [place] Association of Diabetes Care and Education Specialists: www.diabeteseducator.org Summary It is important to have healthy eating habits because your blood sugar (glucose) levels are greatly affected by what you eat and drink. A healthy meal plan will help you control your blood glucose and maintain a healthy lifestyle. Your health care provider may recommend that you work with a dietitian to make a meal plan that is best for you. Keep in  mind that carbohydrates (carbs) and alcohol have immediate effects on your blood glucose levels. It is important to count carbs and to use alcohol carefully. This information is not intended to replace advice given to you by your health care provider. Make sure you discuss any questions you have with your healthcare provider. Document Revised: 07/19/2019 Document Reviewed: 07/19/2019 Elsevier Patient Education  [place].  Diabetes mellitus y nutricin, en adultos Diabetes Mellitus and Nutrition, Adult Si sufre de diabetes, o diabetes mellitus, es muy importante tener hbitos  alimenticios saludables debido a que sus niveles de Designer, television/film set sangre (glucosa) se ven afectados en gran medida por lo que come y bebe. [place] alimentos saludables en las cantidades correctas, aproximadamente a la misma hora todos los [place], [place] ayudar a: Aeronautical engineer glucemia. Disminuir el riesgo de sufrir una enfermedad cardaca. Mejorar la presin arterial. Science writer o mantener un peso saludable. Qu puede afectar mi plan de alimentacin? Todas las personas que sufren de diabetes son diferentes y cada una tiene necesidades diferentes en cuanto a un plan de alimentacin. El mdico puede recomendarle que trabaje con un nutricionista para elaborar el mejor plan para usted. Su plan de alimentacin puede variar segn factores como: Las caloras que necesita. Los medicamentos que toma. Su peso. Sus niveles de glucemia, presin arterial y colesterol. Su nivel de [place]. Otras afecciones que tenga, como enfermedades cardacas o renales. Cmo me afectan los carbohidratos? Los carbohidratos, o hidratos de carbono, afectan su nivel de glucemia ms que cualquier otro tipo de alimento. La ingesta de carbohidratos naturalmente aumenta la cantidad de [employer]. El recuento de carbohidratos es un mtodo destinado a Catering manager un registro de la cantidad de carbohidratos que se consumen. El recuento de carbohidratos es importante para Theatre manager la glucemia a un nivel saludable, especialmente si utiliza insulina o toma determinadosmedicamentos por va oral para la diabetes. Es importante conocer la cantidad de carbohidratos que se pueden ingerir en cada comida sin correr Engineer, manufacturing. Esto es Psychologist, forensic. Su nutricionista puede ayudarlo a calcular la cantidad de carbohidratos que debeingerir en cada comida y en cada refrigerio. Cmo me afecta el alcohol? El alcohol puede provocar disminuciones sbitas de la glucemia (hipoglucemia), especialmente si utiliza insulina o toma determinados  medicamentos por va oral para la diabetes. La hipoglucemia es una afeccin potencialmente mortal. Los sntomas de la hipoglucemia, como somnolencia, mareos y confusin, son similares a los sntomas de haber consumido demasiado alcohol. No beba alcohol si: Su mdico le indica no hacerlo. Est embarazada, puede estar embarazada o est tratando de [place]. Si bebe alcohol: No beba con el estmago vaco. Limite la cantidad que bebe: De 0 a 1 medida por da para las mujeres. De 0 a 2 medidas por da para los hombres. Est atento a la cantidad de alcohol que hay en las bebidas que toma. En los [place], una medida equivale a una botella de cerveza de 12 oz (355 ml), un vaso de vino de 5 oz (148 ml) o un vaso de una bebida alcohlica de alta graduacin de 1 oz (44 ml). Mantngase hidratado bebiendo agua, refrescos dietticos o t helado sin azcar. Tenga en cuenta que los refrescos comunes, los jugos y otras bebida para Optician, dispensing pueden contener mucha azcar y se deben contar como carbohidratos. Consejos para seguir Company secretary las etiquetas de los alimentos Comience por leer el tamao de la porcin en la "Informacin nutricional" en las etiquetas de los alimentos envasados y  las bebidas. La cantidad de caloras, carbohidratos, grasas y otros nutrientes mencionados en la etiqueta se basan en una porcin del alimento. Muchos alimentos contienen ms de una porcin por envase. Verifique la cantidad total de gramos (g) de carbohidratos totales en una porcin. Puede calcular la cantidad de porciones de carbohidratos al dividir el total de carbohidratos por 15. Por ejemplo, si un alimento tiene un total de 30 g de carbohidratos totales por porcin, equivale a 2 porciones de carbohidratos. Verifique la cantidad de gramos (g) de grasas saturadas y grasas trans de una porcin. Escoja alimentos que no contengan estas grasas o que su contenido de estas sea [place]. Verifique la cantidad de  miligramos (mg) de sal (sodio) en una porcin. [place] [employer] de las personas deben limitar la ingesta de sodio total a menos de 2300 mg [employer]. Siempre consulte la informacin nutricional de los alimentos etiquetados como "con bajo contenido de grasa" o "sin grasa". Estos alimentos pueden tener un mayor contenido de Location manager agregada o carbohidratos refinados, y deben evitarse. Hable con su nutricionista para identificar sus objetivos diarios en cuanto a los nutrientes mencionados en la etiqueta. Al ir de compras Evite comprar alimentos procesados, enlatados o precocidos. Estos alimentos tienden a Special educational needs teacher mayor cantidad de [place], sodio y azcar agregada. Compre en la zona exterior de la tienda de comestibles. Esta es la zona donde se encuentran con mayor frecuencia las frutas y las verduras frescas, los cereales a granel, las carnes frescas y los productos lcteos frescos. Al cocinar Utilice mtodos de coccin a baja temperatura, como hornear, en lugar de mtodos de coccin a alta temperatura, como frer en abundante aceite. Cocine con aceites saludables, como el aceite de [place], canola o [place]. Evite cocinar con manteca, crema o carnes con alto contenido de grasa. Planificacin de las comidas Coma las comidas y los refrigerios regularmente, preferentemente a la misma hora todos [place]. Evite pasar largos perodos de tiempo sin comer. Consuma alimentos ricos en fibra, como frutas frescas, verduras, frijoles y cereales integrales. Consulte a su nutricionista sobre cuntas porciones de carbohidratos puede consumir en cada comida. Consuma entre 4 y 6 onzas (entre 112 y 168 g) de protenas magras por da, como carnes Maria Stein, pollo, pescado, huevos o tofu. Una onza (oz) de protena magra equivale a: 1 onza (28 g) de carne, pollo o pescado. 1 huevo.  de taza (62 g) de tofu. Coma algunos alimentos por da que contengan grasas saludables, como aguacates, frutos secos, semillas y pescado. Qu alimentos  debo comer? Lambert Mody Bayas. Manzanas. Naranjas. Duraznos. Damascos. Ciruelas. Uvas. Mango. Papaya.[place]. Kiwi. Cerezas. [place] Valeda Malm. [place]. Verduras de [employer], que incluyen col rizada, [place], hojas de [place] y de [place]. Remolachas. Coliflor. Repollo. Brcoli. Zanahorias. Judas verdes. Tomates. Pimientos. Cebollas. Pepinos. Coles deBruselas. Granos Granos integrales, como panes, galletas, tortillas, cereales y pastas desalvado o integrales. Avena sin azcar. Quinua. Arroz integral o salvaje. Carnes y Psychiatric nurse. Carne de ave sin piel. Cortes magros de ave y carne de res. Tofu.Frutos secos. Semillas. Lcteos Productos lcteos sin grasa o con bajo contenido de [place], [place], yogur [place]. Es posible que los productos que se enumeran ms [place] no constituyan una lista completa de los alimentos y las bebidas que puede tomar. Consulte a un nutricionista para obtener ms informacin. Qu alimentos debo evitar? Lambert Mody Frutas enlatadas al almbar. Verduras Verduras enlatadas. Verduras congeladas con mantequilla o salsa de crema. Granos Productos elaborados con [place] y [place], como panes, pastas,bocadillos y cereales. Evite  todos los alimentos procesados. Carnes y otras protenas Cortes de carne con alto contenido de Lobbyist. Carne de ave con piel. Carnesempanizadas o fritas. Carne procesada. Evite las grasas saturadas. Lcteos [place]og[place], [place] enteros. Bebidas Bebidas azucaradas, como gaseosas o t helado. Es posible que los productos que se enumeran ms [place] no constituyan una lista completa de los alimentos y las bebidas que Nurse, adult. Consulte a un nutricionista para obtener ms informacin. Preguntas para hacerle al mdico Es necesario que me rena con Radio broadcast assistant en el cuidado de la diabetes? Es necesario que me rena con un nutricionista? A qu nmero puedo llamar si tengo preguntas? Cules son los mejores momentos para  controlar la glucemia? Dnde encontrar ms informacin: Asociacin Estadounidense de la Diabetes (American Diabetes Association): diabetes.org Academy of Nutrition and Dietetics (Academia de Nutricin y Information systems manager): www.eatright.[employer] of Diabetes and Digestive and Kidney Diseases ([place] la Diabetes y [place] y [place]): [place]Funeral.dk Association of Diabetes Care and Education Specialists (Asociacin de Especialistas en Atencin y Educacin sobre la Diabetes): www.diabeteseducator.org Resumen Es importante tener hbitos alimenticios saludables debido a que sus niveles de Designer, television/film set sangre (glucosa) se ven afectados en gran medida por lo que come y bebe. Un plan de alimentacin saludable lo ayudar a controlar la glucemia y Theatre manager un estilo de vida saludable. El mdico puede recomendarle que trabaje con un nutricionista para elaborar el mejor plan para usted. Tenga en cuenta que los carbohidratos (hidratos de carbono) y el alcohol tienen efectos inmediatos en sus niveles de glucemia. Es importante contar los carbohidratos que ingiere y consumir alcohol con prudencia. Esta informacin no tiene Marine scientist el consejo del mdico. Asegresede hacerle al mdico cualquier pregunta que tenga. Document Revised: 09/15/2019 Document Reviewed: 09/15/2019 Elsevier Patient Education  2021 [employer].

## 2021-04-08 NOTE — Progress Notes (Signed)
Tim Calderon, Tim Calderon  [id number] Phone:  [phone]   Fax:  [phone]   Established Patient Office Visit  Subjective:  Patient ID: Tim Calderon, male    DOB: [birth year]  Age: 40 y.o. MRN: [id number]  CC:  Chief Complaint  Patient presents with   Follow-up    4 week follow up, fasting labs. No longer wants to be in insulin would like to try pills if possible.     HPI Tim Calderon presents for follow up. He  has a past medical history of Leukemia ([place]).   Diabetes Mellitus Patient presents for follow up of diabetes. Current symptoms include: hyperglycemia. Symptoms have stabilized. Patient denies foot ulcerations, hypoglycemia , nausea, paresthesia of the feet, polydipsia, polyuria, visual disturbances, vomiting, and weight loss. Evaluation to date has included: fasting blood sugar and hemoglobin A1C.  Home sugars: BGs have been labile ranging between 116 and 240 . Current treatment: more intensive attention to diet which has been effective and Continued insulin which has been somewhat effective. Last dilated eye exam: pending. He has been doing well making dietary changes.  He has requested to be taken off the Levemir.  This is due to his busy life.  This was started because his A1c was greater than 12%; there is significant improvement in his home CBG and his symptoms.  Past Medical History:  Diagnosis Date   Leukemia [facility])     Past Surgical History:  Procedure Laterality Date   CHEST SURGERY      Family History  Problem Relation Age of Onset   Diabetes Mother    Diabetes Father    Diabetes Sister    Diabetes Brother     Social History   Socioeconomic History   Marital status: Married    Spouse name: Not on file   Number of children: Not on file   Years of education: Not on file   Highest education level: Not on file  Occupational History   Not on file  Tobacco Use   Smoking status: Never   Smokeless tobacco:  Never  Substance and Sexual Activity   Alcohol use: Yes    Comment: occ   Drug use: No   Sexual activity: Not on file  Other Topics Concern   Not on file  Social History Narrative   Not on file   Social Determinants of Health   Financial Resource Strain: Not on file  Food Insecurity: Not on file  Transportation Needs: Not on file  Physical Activity: Not on file  Stress: Not on file  Social Connections: Not on file  Intimate Partner Violence: Not on file    Outpatient Medications Prior to Visit  Medication Sig Dispense Refill   blood glucose meter kit and supplies KIT Dispense based on patient and insurance preference. Use up to four times daily as directed. 1 each 0   glucose blood test strip Use as instructed 100 each 12   insulin detemir (LEVEMIR FLEXTOUCH) 100 UNIT/ML FlexPen Inject 20 Units into the skin 2 (two) times daily. 15 mL 11   Insulin Pen Needle (COMFORT TOUCH INSULIN PEN NEED) 32G X 6 MM MISC 1 pen by Does not apply route in the morning and at bedtime. 100 each 11   No facility-administered medications prior to visit.    Allergies  Allergen Reactions   Cytarabine Rash and Shortness Of Breath    Other reaction(s): Other (See Comments) [Rash] [Other] SOB,  chest pain   Shrimp (Diagnostic) Anaphylaxis, Nausea And Vomiting and Rash   Vancomycin     Other reaction(s): Other (See Comments) Premed with Benadryl   Cefepime Rash   Ceftazidime Rash    ROS Review of Systems    Objective:    Physical Exam Constitutional:      General: He is not in acute distress.    Appearance: He is obese. He is not ill-appearing, toxic-appearing or diaphoretic.  HENT:     Head: Normocephalic and atraumatic.     Nose: Nose normal.     Mouth/Throat:     Mouth: Mucous membranes are moist.  Cardiovascular:     Rate and Rhythm: Normal rate and regular rhythm.     Pulses: Normal pulses.     Heart sounds: Normal heart sounds.  Pulmonary:     Effort: Pulmonary effort is  normal.     Breath sounds: Normal breath sounds.  Abdominal:     Palpations: Abdomen is soft.  Musculoskeletal:        General: Normal range of motion.     Cervical back: Normal range of motion.  Skin:    General: Skin is warm and dry.     Capillary Refill: Capillary refill takes less than 2 seconds.  Neurological:     General: No focal deficit present.     Mental Status: He is alert and oriented to person, place, and time.  Psychiatric:        Mood and Affect: Mood normal.        Behavior: Behavior normal.        Thought Content: Thought content normal.        Judgment: Judgment normal.   BP 100/63 (BP Location: Left Arm, Patient Position: Sitting)   Pulse 74   Temp (!) 97.3 F (36.3 C)   Ht '5\' 3"'  (1.6 m)   Wt 201 lb 0.2 oz (91.2 kg)   SpO2 98%   BMI 35.61 kg/m  Wt Readings from Last 3 Encounters:  04/08/21 201 lb 0.2 oz (91.2 kg)  03/06/21 199 lb 0.2 oz (90.3 kg)  10/21/20 206 lb (93.4 kg)     Health Maintenance Due  Topic Date Due   COVID-19 Vaccine (4 - Booster for Pfizer series) 10/25/2020   INFLUENZA VACCINE  03/25/2021    There are no preventive care reminders to display for this patient.  Lab Results  Component Value Date   TSH 1.670 04/05/2018   Lab Results  Component Value Date   WBC 5.7 03/05/2021   HGB 15.4 03/05/2021   HCT 43.1 03/05/2021   MCV 92.9 03/05/2021   PLT 208 03/05/2021   Lab Results  Component Value Date   NA 136 03/05/2021   K 3.9 03/05/2021   CO2 25 03/05/2021   GLUCOSE 332 (H) 03/05/2021   BUN 7 03/05/2021   CREATININE 0.77 03/05/2021   BILITOT 1.2 03/05/2021   ALKPHOS 97 03/05/2021   AST 191 (H) 03/05/2021   ALT 247 (H) 03/05/2021   PROT 7.0 03/05/2021   ALBUMIN 3.7 03/05/2021   CALCIUM 8.9 03/05/2021   ANIONGAP 8 03/05/2021   Lab Results  Component Value Date   CHOL 165 04/05/2018   Lab Results  Component Value Date   HDL 33 (L) 04/05/2018   Lab Results  Component Value Date   LDLCALC 79 04/05/2018   Lab  Results  Component Value Date   TRIG 266 (H) 04/05/2018   Lab Results  Component Value Date  CHOLHDL 5.0 04/05/2018   Lab Results  Component Value Date   HGBA1C 12.7 (A) 03/06/2021   HGBA1C 12.7 03/06/2021   HGBA1C 12.7 (A) 03/06/2021   HGBA1C 12.7 (A) 03/06/2021      Assessment & Plan:   Problem List Items Addressed This Visit   None Visit Diagnoses     Uncontrolled type 2 diabetes mellitus with hyperglycemia ([place])    -  Primary Encourage compliance with current treatment regimen  Dose adjustment metformin 1000 mg BID. Discontinued insulin per patient preference. Symptoms have improved and CBG is stable as above.  Encourage regular CBG monitoring Encourage contacting office if excessive hyperglycemia and or hypoglycemia Lifestyle modification with healthy diet (fewer calories, more high fiber foods, whole grains and non-starchy vegetables, lower fat meat and fish, low-fat diary include healthy oils) regular exercise (physical activity) and weight loss Opthalmology exam discussed     Relevant Medications   metFORMIN (GLUCOPHAGE) 1000 MG tablet   Other Relevant Orders   Comp. Metabolic Panel (12)   Screening for cholesterol level     Will add statin at next visit   Relevant Orders   Lipid panel       Meds ordered this encounter  Medications   metFORMIN (GLUCOPHAGE) 1000 MG tablet    Sig: Take 1 tablet (1,000 mg total) by mouth 2 (two) times daily with a meal.    Dispense:  180 tablet    Refill:  3    Order Specific Question:   Supervising Provider    Answer:   Tresa Garter [[id number]]    Follow-up: Return in about 4 weeks (around 05/06/2021) for follow up [id number] medication change, follow up [id number].    Vevelyn Francois, NP

## 2021-04-09 LAB — COMP. METABOLIC PANEL (12)
AST: 84 IU/L — ABNORMAL HIGH (ref 0–40)
Albumin/Globulin Ratio: 1.5 (ref 1.2–2.2)
Albumin: 4.4 g/dL (ref 4.0–5.0)
Alkaline Phosphatase: 91 IU/L (ref 44–121)
BUN/Creatinine Ratio: 18 (ref 9–20)
BUN: 12 mg/dL (ref 6–24)
Bilirubin Total: 0.4 mg/dL (ref 0.0–1.2)
Calcium: 9 mg/dL (ref 8.7–10.2)
Chloride: 100 mmol/L (ref 96–106)
Creatinine, Ser: 0.67 mg/dL — ABNORMAL LOW (ref 0.76–1.27)
Globulin, Total: 2.9 g/dL (ref 1.5–4.5)
Glucose: 134 mg/dL — ABNORMAL HIGH (ref 65–99)
Potassium: 4.5 mmol/L (ref 3.5–5.2)
Sodium: 140 mmol/L (ref 134–144)
Total Protein: 7.3 g/dL (ref 6.0–8.5)
eGFR: 121 mL/min/{1.73_m2} (ref >59)

## 2021-04-09 LAB — LIPID PANEL
Chol/HDL Ratio: 3.9 ratio (ref 0.0–5.0)
Cholesterol, Total: 172 mg/dL (ref 100–199)
HDL: 44 mg/dL (ref >39)
LDL Chol Calc (NIH): 107 mg/dL — ABNORMAL HIGH (ref 0–99)
Triglycerides: 116 mg/dL (ref 0–149)
VLDL Cholesterol Cal: 21 mg/dL (ref 5–40)

## 2021-04-22 ENCOUNTER — Ambulatory Visit: Payer: BC Managed Care – PPO | Admitting: Nurse Practitioner

## 2021-04-23 DIAGNOSIS — Z3189 Encounter for other procreative management: Secondary | ICD-10-CM | POA: Diagnosis not present

## 2021-05-06 ENCOUNTER — Encounter: Payer: Self-pay | Admitting: Nurse Practitioner

## 2021-05-06 ENCOUNTER — Other Ambulatory Visit: Payer: Self-pay

## 2021-05-06 ENCOUNTER — Ambulatory Visit: Payer: BC Managed Care – PPO | Admitting: Nurse Practitioner

## 2021-05-06 VITALS — BP 117/71 | HR 106 | Temp 98.0°F | Ht 63.0 in | Wt 203.4 lb

## 2021-05-06 DIAGNOSIS — E1165 Type 2 diabetes mellitus with hyperglycemia: Secondary | ICD-10-CM

## 2021-05-06 MED ORDER — SIMVASTATIN 5 MG PO TABS: 5.0000 mg | ORAL_TABLET | Freq: Every day | ORAL | 3 refills | Status: DC

## 2021-05-06 NOTE — Patient Instructions (Addendum)
Conceptos bsicos de la diabetes mellitus Diabetes Mellitus Basics La diabetes mellitus, o (diabetes) es una enfermedad prolongada (crnica). Se produce cuando el cuerpo no utiliza Occupational hygienist (glucosa) que se libera de los alimentos despus de comer. La diabetes mellitus puede deberse a uno de Mirant o a ambos: El pncreas no produce suficiente cantidad de una hormona llamada insulina. El cuerpo no reacciona de forma normal a la insulina que produce. La insulina permite que la glucosa ingrese a las clulas del cuerpo. Esto le proporciona energa. Si tiene diabetes, la glucosa no puede ingresar a las clulas. Esto produce un aumento de la glucemia (hiperglucemia). Cmo tratar y Chief Technology Officer la diabetes Es posible que tenga que administrarse insulina u otros medicamentos para la diabetes todos los [place] para mantener el nivel de glucosa en la sangre equilibrado. Si le recetan insulina, aprender cmo aplicrsela con inyecciones. Es posible que deba ajustar la cantidad de insulina en funcin de los alimentos que coma. Tendr que controlarse los niveles de glucemia con un monitor de glucosa como se lo haya indicado el mdico. Las lecturas pueden ayudar a Teacher, adult education si tiene un nivel bajo o alto de glucemia. Generalmente, los resultados de los niveles de glucemia deben ser los siguientes: Antes de las comidas (preprandial): de 80 a 130 mg/dl (de 4,4 a 7,2 mmol/l). Despus de las comidas (posprandial): por debajo de 180 mg/dl (10 mmol/l). Nivel de hemoglobina A1c (HbA1c): menos del 7 %. El mdico fijar objetivos de tratamiento para usted. Cumpla con todas las visitas de seguimiento. Esto es importante. Siga estas instrucciones en su casa: Medicamentos para la diabetes Use los medicamentos para la diabetes como se lo haya indicado el mdico. Haga una lista de los medicamentos para la diabetes aqu: Nombre del medicamento: ______________________________ [employer] (dosis):  ________________ Mellody Drown (a. m./p. m.): _______________ Wilford Grist: ___________________________________ Valrie Hart del medicamento: ______________________________ Cantidad (dosis): ________________ Mellody Drown (a. m./p. m.): _______________ Notas: ___________________________________ Valrie Hart del medicamento: ______________________________ Cantidad (dosis): ________________ Mellody Drown (a. m./p. m.): _______________ Notas: ___________________________________ Missy Sabins [place] insulina, haga una lista de los tipos de [place] aqu: Tipo de insulina: ______________________________ Cantidad (dosis): ________________ Mellody Drown (a. m./p. m.): _______________Notas: ___________________________________ Suezanne Jacquet: ______________________________ Cantidad (dosis): ________________ Mellody Drown (a. m./p. m.): _______________ Notas: ___________________________________ Suezanne Jacquet: ______________________________ Cantidad (dosis): ________________ Mellody Drown (a. m./p. m.): _______________ Notas: ___________________________________ Suezanne Jacquet: ______________________________ Cantidad (dosis): ________________ Mellody Drown (a. m./p. m.): _______________ Notas: ___________________________________ Suezanne Jacquet: ______________________________ Cantidad (dosis): ________________ Mellody Drown (a. m./p. m.): _______________ Notas: ___________________________________ Cmo controlar la glucemia Contrlese los niveles de glucemia con un monitor de glucosa como se lo haya indicado el mdico. Anote las veces que se controla los Dennis de glucosa: Hora: _______________ Wilford Grist: ___________________________________ Hora: _______________ Notas: ___________________________________ Hora: _______________ Notas: ___________________________________ Hora: _______________ Notas: ___________________________________ Hora: _______________ Notas: ___________________________________ Hora: _______________ Notas: ___________________________________  Rosalyn Charters de  glucemia El nivel bajo de glucemia (hipoglucemia) se produce cuando la glucosa es igual o menor que 70 mg/dl (3.9 mmol/l). Entre los sntomas, se pueden incluir los siguientes: Sentir: Rudolph. Sudoracin y piel hmeda. Irritabilidad o enfado con facilidad. Mareos. Somnolencia. Tener: Latidos cardacos acelerados. Dolor de [place]. Cambios en la visin. Hormigueo o adormecimiento alrededor de la boca, labios o lengua. Dificultades para hacer lo siguiente: Moverse (coordinacin). Dormir. Cmo tratar un nivel bajo de glucemia Para tratar un nivel bajo de glucemia, ingiera un alimento o una bebida azucarada de inmediato. Si puede pensar con claridad y tragar de manera segura, siga la regla 15/15, que consiste en lo siguiente:  Tome 15 gramos de un hidrato de carbono (carbohidrato) de accin rpida, como le indic su mdico. Algunos hidratos de carbono de accin rpida son: Comprimidos de glucosa: tome 3 o 4 comprimidos. Caramelos duros: coma 3 a 5 unidades. Jugo de fruta: beba 4 onzas (120 ml). Refresco comn (no diettico): beba de 4 a 6 onzas (de 120 a 180 ml). Miel o azcar: coma 1 cucharada (15 ml). Verifique sus niveles de glucemia 15 minutes despus de ingerir el hidrato de carbono. Si la glucosa todava es igual o menor que 70 mg/dl (3.9 mmol/l), ingiera nuevamente 15 gramos de un hidrato de carbono. Si la glucosa no supera los 70 mg/dl (3.9 mmol/l) despus de 3 intentos, solicite ayuda de inmediato. Ingiera una comida o una colacin en el transcurso de 1 hora despus de que la glucosa se haya normalizado. Cmo tratar un nivel muy bajo de glucemia Si la glucosa es igual o menor que 54 mg/dl (3 mmol/l), significa que su nivel de glucemia est muy bajo (hipoglucemia grave). Esto es Engineer, maintenance (IT). No espere a ver si los sntomas desaparecen. Solicite atencin mdica de inmediato. Comunquese con el servicio de emergencias de su localidad (911 en los [place]). No conduzca por sus  propios medios [employer] hospital. Preguntas para hacerle al mdico Es necesario que converse con un educador en el cuidado de la diabetes? Qu equipos necesitar para cuidarme en casa? Qu medicamentos para la diabetes necesito? Cundo debo tomarlos? Con qu frecuencia debo comprobar mis niveles de glucemia? A qu nmero puedo llamar si tengo preguntas? Cundo es mi visita de seguimiento? Dnde puedo encontrar un grupo de apoyo para las personas con diabetes? Dnde buscar ms informacin American Diabetes Association (Asociacin Estadounidense de la Diabetes): www.diabetes.org Association of Diabetes Care and Education Specialists (Asociacin de Especialistas en Atencin y Educacin sobre la Diabetes): www.diabeteseducator.org Comunquese con un mdico si: El nivel de glucemia es mayor o igual que 240 mg/dl (13.3 mmol/dl) durante 2 das seguidos. Ha estado enfermo o ha tenido fiebre durante 2 das o ms y no [place]. Tiene alguno de estos problemas durante ms de 6 horas: No puede comer ni beber. Siente nuseas. Vomita. Tiene diarrea. Solicite ayuda de inmediato si: Su nivel de glucemia est por debajo de 54 mg/dl (3 mmol/l). Se siente confundido. Tiene problemas para pensar con claridad. Tiene dificultad para respirar. Estos sntomas pueden representar un problema grave que constituye Engineer, maintenance (IT). No espere a ver si los sntomas desaparecen. Solicite atencin mdica de inmediato. Comunquese con el servicio de emergencias de su localidad (911 en los [place]). No conduzca por sus propios medios [employer]. Resumen La diabetes mellitus es una enfermedad crnica que se produce cuando el cuerpo no utiliza Occupational hygienist (glucosa) que se libera de los alimentos despus de comer. Aplquese la insulina y tome los medicamentos para la diabetes como se lo hayan indicado. Controle su nivel de glucemia todos los [place], con la frecuencia que le hayan  indicado. Cumpla con todas las visitas de seguimiento. Esto es importante. Esta informacin no tiene Marine scientist el consejo del mdico. Asegrese de hacerle al mdico cualquier pregunta que tenga. Document Revised: 02/08/2020 Document Reviewed: 02/08/2020 Elsevier Patient Education  [place].  Diabetes mellitus y nutricin, en adultos Diabetes Mellitus and Nutrition, Adult Si sufre de diabetes, o diabetes mellitus, es muy importante tener hbitos alimenticios saludables debido a que sus niveles de Designer, television/film set sangre (glucosa) se ven afectados en gran medida por lo que  come y bebe. [place] alimentos saludables en las cantidades correctas, aproximadamente a la misma hora todos los [place], [place] ayudar a: Aeronautical engineer glucemia. Disminuir el riesgo de sufrir una enfermedad cardaca. Mejorar la presin arterial. Science writer o mantener un peso saludable. Qu puede afectar mi plan de alimentacin? Todas las personas que sufren de diabetes son diferentes y cada una tiene necesidades diferentes en cuanto a un plan de alimentacin. El mdico puede recomendarle que trabaje con un nutricionista para elaborar el mejor plan para usted. Su plan de alimentacin puede variar segn factores como: Las caloras que necesita. Los medicamentos que toma. Su peso. Sus niveles de glucemia, presin arterial y colesterol. Su nivel de [place]. Otras afecciones que tenga, como enfermedades cardacas o renales. Cmo me afectan los carbohidratos? Los carbohidratos, o hidratos de carbono, afectan su nivel de glucemia ms que cualquier otro tipo de alimento. La ingesta de carbohidratos naturalmente aumenta la cantidad de [employer]. El recuento de carbohidratos es un mtodo destinado a Catering manager un registro de la cantidad de carbohidratos que se consumen. El recuento de carbohidratos es importante para Theatre manager la glucemia a un nivel saludable, especialmente si utiliza insulina o toma determinados  medicamentos por va oral para la diabetes. Es importante conocer la cantidad de carbohidratos que se pueden ingerir en cada comida sin correr Engineer, manufacturing. Esto es Psychologist, forensic. Su nutricionista puede ayudarlo a calcular la cantidad de carbohidratos que debe ingerir en cada comida y en cada refrigerio. Cmo me afecta el alcohol? El alcohol puede provocar disminuciones sbitas de la glucemia (hipoglucemia), especialmente si utiliza insulina o toma determinados medicamentos por va oral para la diabetes. La hipoglucemia es una afeccin potencialmente mortal. Los sntomas de la hipoglucemia, como somnolencia, mareos y confusin, son similares a los sntomas de haber consumido demasiado alcohol. No beba alcohol si: Su mdico le indica no hacerlo. Est embarazada, puede estar embarazada o est tratando de [place]. Si bebe alcohol: No beba con el estmago vaco. Limite la cantidad que bebe: De 0 a 1 medida por da para las mujeres. De 0 a 2 medidas por da para los hombres. Est atento a la cantidad de alcohol que hay en las bebidas que toma. En los [place], una medida equivale a una botella de cerveza de 12 oz (355 ml), un vaso de vino de 5 oz (148 ml) o un vaso de una bebida alcohlica de alta graduacin de 1 oz (44 ml). Mantngase hidratado bebiendo agua, refrescos dietticos o t helado sin azcar. Tenga en cuenta que los refrescos comunes, los jugos y otras bebida para Optician, dispensing pueden contener mucha azcar y se deben contar como carbohidratos. Consejos para seguir Photographer las etiquetas de los alimentos Comience por leer el tamao de la porcin en la "Informacin nutricional" en las etiquetas de los alimentos envasados y las bebidas. La cantidad de caloras, carbohidratos, grasas y otros nutrientes mencionados en la etiqueta se basan en una porcin del alimento. Muchos alimentos contienen ms de una porcin por envase. Verifique la cantidad total de gramos (g)  de carbohidratos totales en una porcin. Puede calcular la cantidad de porciones de carbohidratos al dividir el total de carbohidratos por 15. Por ejemplo, si un alimento tiene un total de 30 g de carbohidratos totales por porcin, equivale a 2 porciones de carbohidratos. Verifique la cantidad de gramos (g) de grasas saturadas y grasas trans de una porcin. Escoja alimentos que no contengan estas grasas o que su contenido de estas sea  bajo. Verifique la cantidad de miligramos (mg) de sal (sodio) en una porcin. La [employer] de las personas deben limitar la ingesta de sodio total a menos de 2300 mg Honeywell. Siempre consulte la informacin nutricional de los alimentos etiquetados como "con bajo contenido de grasa" o "sin grasa". Estos alimentos pueden tener un mayor contenido de Location manager agregada o carbohidratos refinados, y deben evitarse. Hable con su nutricionista para identificar sus objetivos diarios en cuanto a los nutrientes mencionados en la etiqueta. Al ir de compras Evite comprar alimentos procesados, enlatados o precocidos. Estos alimentos tienden a Special educational needs teacher mayor cantidad de [place], sodio y azcar agregada. Compre en la zona exterior de la tienda de comestibles. Esta es la zona donde se encuentran con mayor frecuencia las frutas y las verduras frescas, los cereales a granel, las carnes frescas y los productos lcteos frescos. Al cocinar Utilice mtodos de coccin a baja temperatura, como hornear, en lugar de mtodos de coccin a alta temperatura, como frer en abundante aceite. Cocine con aceites saludables, como el aceite de [place], canola o [place]. Evite cocinar con manteca, crema o carnes con alto contenido de grasa. Planificacin de las comidas Coma las comidas y los refrigerios regularmente, preferentemente a la misma hora todos [place]. Evite pasar largos perodos de tiempo sin comer. Consuma alimentos ricos en fibra, como frutas frescas, verduras, frijoles y cereales integrales. Consulte  a su nutricionista sobre cuntas porciones de carbohidratos puede consumir en cada comida. Consuma entre 4 y 6 onzas (entre 112 y 168 g) de protenas magras por da, como carnes [place], pollo, pescado, huevos o tofu. Una onza (oz) de protena magra equivale a: 1 onza (28 g) de carne, pollo o pescado. 1 huevo.  de taza (62 g) de tofu. Coma algunos alimentos por da que contengan grasas saludables, como aguacates, frutos secos, semillas y pescado. Qu alimentos debo comer? Lambert Mody Bayas. Manzanas. Naranjas. Duraznos. Damascos. Ciruelas. Uvas. Mango. Papaya. [place]. Kiwi. Cerezas. [place] Valeda Malm. Espinaca. Verduras de Boeing, que incluyen col rizada, [address], hojas de [place] y de [place]. Remolachas. Coliflor. Repollo. Brcoli. Zanahorias. Judas verdes. Tomates. Pimientos. Cebollas. Pepinos. Coles de Bruselas. Granos Granos integrales, como panes, galletas, tortillas, cereales y pastas de salvado o integrales. Avena sin azcar. Quinua. Arroz integral o salvaje. Carnes y Psychiatric nurse. Carne de ave sin piel. Cortes magros de ave y carne de res. Tofu. Frutos secos. Semillas. Lcteos Productos lcteos sin grasa o con bajo contenido de [place], [place], yogur y [place]. Es posible que los productos que se enumeran ms [place] no constituyan una lista completa de los alimentos y las bebidas que puede tomar. Consulte a un nutricionista para obtener ms informacin. Qu alimentos debo evitar? Lambert Mody Frutas enlatadas al almbar. Verduras Verduras enlatadas. Verduras congeladas con mantequilla o salsa de crema. Granos Productos elaborados con [place] y [place], como panes, pastas, bocadillos y cereales. Evite todos los alimentos procesados. Carnes y otras protenas Cortes de carne con alto contenido de Lobbyist. Carne de ave con piel. Carnes empanizadas o fritas. Carne procesada. Evite las grasas saturadas. Lcteos Yogur, [place] enteros. Bebidas Bebidas  azucaradas, como gaseosas o t helado. Es posible que los productos que se enumeran ms [place] no constituyan una lista completa de los alimentos y las bebidas que Nurse, adult. Consulte a un nutricionista para obtener ms informacin. Preguntas para hacerle al mdico Es necesario que me rena con Radio broadcast assistant en el cuidado de la diabetes? Es necesario que me rena con un nutricionista? A  qu nmero puedo llamar si tengo preguntas? Cules son los mejores momentos para controlar la glucemia? Dnde encontrar ms informacin: Asociacin Estadounidense de la Diabetes (American Diabetes Association): diabetes.org Academy of Nutrition and Dietetics (Academia de Nutricin y Information systems manager): www.eatright.Unisys Corporation of Diabetes and Digestive and Kidney Diseases ([place] la Diabetes y [place] y [place]): DesMoinesFuneral.dk Association of Diabetes Care and Education Specialists (Asociacin de Especialistas en Atencin y Educacin sobre la Diabetes): www.diabeteseducator.org Resumen Es importante tener hbitos alimenticios saludables debido a que sus niveles de Designer, television/film set sangre (glucosa) se ven afectados en gran medida por lo que come y bebe. Un plan de alimentacin saludable lo ayudar a controlar la glucemia y Theatre manager un estilo de vida saludable. El mdico puede recomendarle que trabaje con un nutricionista para elaborar el mejor plan para usted. Tenga en cuenta que los carbohidratos (hidratos de carbono) y el alcohol tienen efectos inmediatos en sus niveles de glucemia. Es importante contar los carbohidratos que ingiere y consumir alcohol con prudencia. Esta informacin no tiene Marine scientist el consejo del mdico. Asegrese de hacerle al mdico cualquier pregunta que tenga. Document Revised: 09/15/2019 Document Reviewed: 09/15/2019 Elsevier Patient Education  2021 [employer].

## 2021-05-06 NOTE — Progress Notes (Signed)
[place], [place]  [address] Phone:  [phone]   Fax:  [phone]   Established Patient Office Visit  Subjective:  Patient ID: Tim Calderon, male    DOB: Apr 22, 1981  Age: 40 y.o. MRN: [id number]  CC:  Chief Complaint  Patient presents with   Follow-up    Pt is here for a follow up visit for his diabetic medication change. Pt states that he has been checking his blood sugars. Pt states that his glucose level was 120 this morning.     HPI Tim Calderon presents for follow up. He  has a past medical history of Leukemia ([place]).   He reports that his CBG has been 90-mid 100's. He denies any side effects. He reports that he had one episode of hypoglycemia because he took the metformin without eating.Denies headache, dizziness, visual changes, shortness of breath, dyspnea on exertion, chest pain, nausea, vomiting or any edema.    Past Medical History:  Diagnosis Date   Leukemia [facility])     Past Surgical History:  Procedure Laterality Date   CHEST SURGERY      Family History  Problem Relation Age of Onset   Diabetes Mother    Diabetes Father    Diabetes Sister    Diabetes Brother     Social History   Socioeconomic History   Marital status: Married    Spouse name: Not on file   Number of children: Not on file   Years of education: Not on file   Highest education level: Not on file  Occupational History   Not on file  Tobacco Use   Smoking status: Never   Smokeless tobacco: Never  Vaping Use   Vaping Use: Never used  Substance and Sexual Activity   Alcohol use: Not Currently    Comment: occ   Drug use: No   Sexual activity: Yes    Birth control/protection: None  Other Topics Concern   Not on file  Social History Narrative   Not on file   Social Determinants of Health   Financial Resource Strain: Not on file  Food Insecurity: Not on file  Transportation Needs: Not on file  Physical Activity: Not on  file  Stress: Not on file  Social Connections: Not on file  Intimate Partner Violence: Not on file    Outpatient Medications Prior to Visit  Medication Sig Dispense Refill   blood glucose meter kit and supplies KIT Dispense based on patient and insurance preference. Use up to four times daily as directed. 1 each 0   glucose blood test strip Use as instructed 100 each 12   metFORMIN (GLUCOPHAGE) 1000 MG tablet Take 1 tablet (1,000 mg total) by mouth 2 (two) times daily with a meal. 180 tablet 3   No facility-administered medications prior to visit.    Allergies  Allergen Reactions   Cytarabine Rash and Shortness Of Breath    Other reaction(s): Other (See Comments) [Rash] [Other] SOB, chest pain   Shrimp (Diagnostic) Anaphylaxis, Nausea And Vomiting and Rash   Vancomycin     Other reaction(s): Other (See Comments) Premed with Benadryl   Cefepime Rash   Ceftazidime Rash    ROS Review of Systems    Objective:    Physical Exam Constitutional:      General: He is not in acute distress.    Appearance: He is obese. He is not ill-appearing, toxic-appearing or diaphoretic.  HENT:  Head: Normocephalic and atraumatic.     Nose: Nose normal.     Mouth/Throat:     Mouth: Mucous membranes are moist.  Cardiovascular:     Rate and Rhythm: Normal rate and regular rhythm.     Pulses: Normal pulses.     Heart sounds: Normal heart sounds.  Pulmonary:     Effort: Pulmonary effort is normal.     Breath sounds: Normal breath sounds.  Abdominal:     Palpations: Abdomen is soft.  Musculoskeletal:        General: Normal range of motion.     Cervical back: Normal range of motion.  Skin:    General: Skin is warm and dry.     Capillary Refill: Capillary refill takes less than 2 seconds.  Neurological:     General: No focal deficit present.     Mental Status: He is alert and oriented to person, place, and time.  Psychiatric:        Mood and Affect: Mood normal.        Behavior:  Behavior normal.        Thought Content: Thought content normal.        Judgment: Judgment normal.    BP 117/71   Pulse (!) 106   Temp 98 F (36.7 C)   Ht _0  (1.6 m)   Wt 203 lb 6.4 oz (92.3 kg)   SpO2 97%   BMI 36.03 kg/m  Wt Readings from Last 3 Encounters:  05/06/21 203 lb 6.4 oz (92.3 kg)  04/08/21 201 lb 0.2 oz (91.2 kg)  03/06/21 199 lb 0.2 oz (90.3 kg)     Health Maintenance Due  Topic Date Due   COVID-19 Vaccine (4 - Booster for Pfizer series) 10/19/2020   INFLUENZA VACCINE  03/25/2021    There are no preventive care reminders to display for this patient.  Lab Results  Component Value Date   TSH 1.670 04/05/2018   Lab Results  Component Value Date   WBC 5.7 03/05/2021   HGB 15.4 03/05/2021   HCT 43.1 03/05/2021   MCV 92.9 03/05/2021   PLT 208 03/05/2021   Lab Results  Component Value Date   NA 140 04/08/2021   K 4.5 04/08/2021   CO2 25 03/05/2021   GLUCOSE 134 (H) 04/08/2021   BUN 12 04/08/2021   CREATININE 0.67 (L) 04/08/2021   BILITOT 0.4 04/08/2021   ALKPHOS 91 04/08/2021   AST 84 (H) 04/08/2021   ALT 247 (H) 03/05/2021   PROT 7.3 04/08/2021   ALBUMIN 4.4 04/08/2021   CALCIUM 9.0 04/08/2021   ANIONGAP 8 03/05/2021   EGFR 121 04/08/2021   Lab Results  Component Value Date   CHOL 172 04/08/2021   Lab Results  Component Value Date   HDL 44 04/08/2021   Lab Results  Component Value Date   LDLCALC 107 (H) 04/08/2021   Lab Results  Component Value Date   TRIG 116 04/08/2021   Lab Results  Component Value Date   CHOLHDL 3.9 04/08/2021   Lab Results  Component Value Date   HGBA1C 12.7 (A) 03/06/2021   HGBA1C 12.7 03/06/2021   HGBA1C 12.7 (A) 03/06/2021   HGBA1C 12.7 (A) 03/06/2021      Assessment & Plan:   Problem List Items Addressed This Visit   None Visit Diagnoses     Uncontrolled type 2 diabetes mellitus with hyperglycemia ([place])    -  Primary Persistent however improving Encourage compliance with current  treatment regimen  Dose adjustment started Simvastatin 5 mg qd Encourage regular CBG monitoring Encourage contacting office if excessive hyperglycemia and or hypoglycemia Lifestyle modification with healthy diet (fewer calories, more high fiber foods, whole grains and non-starchy vegetables, lower fat meat and fish, low-fat diary include healthy oils) regular exercise (physical activity) and weight loss    Relevant Medications   simvastatin (ZOCOR) 5 MG tablet       Meds ordered this encounter  Medications   simvastatin (ZOCOR) 5 MG tablet    Sig: Take 1 tablet (5 mg total) by mouth daily.    Dispense:  90 tablet    Refill:  3    Order Specific Question:   Supervising Provider    Answer:   Tresa Garter [id number]    Follow-up: Return in about 6 weeks (around 06/17/2021) for follow up [id number].    Vevelyn Francois, NP

## 2021-06-04 DIAGNOSIS — Z3189 Encounter for other procreative management: Secondary | ICD-10-CM | POA: Diagnosis not present

## 2021-06-17 DIAGNOSIS — Z319 Encounter for procreative management, unspecified: Secondary | ICD-10-CM | POA: Diagnosis not present

## 2021-06-21 ENCOUNTER — Ambulatory Visit: Payer: BC Managed Care – PPO | Admitting: Nurse Practitioner

## 2021-06-21 ENCOUNTER — Encounter: Payer: Self-pay | Admitting: Nurse Practitioner

## 2021-06-21 ENCOUNTER — Other Ambulatory Visit: Payer: Self-pay

## 2021-06-21 VITALS — BP 106/66 | HR 69 | Temp 98.1°F | Ht 63.0 in | Wt 201.8 lb

## 2021-06-21 DIAGNOSIS — E119 Type 2 diabetes mellitus without complications: Secondary | ICD-10-CM | POA: Diagnosis not present

## 2021-06-21 DIAGNOSIS — R7309 Other abnormal glucose: Secondary | ICD-10-CM | POA: Diagnosis not present

## 2021-06-21 DIAGNOSIS — C9201 Acute myeloblastic leukemia, in remission: Secondary | ICD-10-CM

## 2021-06-21 DIAGNOSIS — E1165 Type 2 diabetes mellitus with hyperglycemia: Secondary | ICD-10-CM | POA: Diagnosis not present

## 2021-06-21 DIAGNOSIS — Z23 Encounter for immunization: Secondary | ICD-10-CM

## 2021-06-21 LAB — POCT GLYCOSYLATED HEMOGLOBIN (HGB A1C)
HbA1c POC (<> result, manual entry): 6.4 % (ref 4.0–5.6)
HbA1c, POC (controlled diabetic range): 6.4 % (ref 0.0–7.0)
HbA1c, POC (prediabetic range): 6.4 % (ref 5.7–6.4)
Hemoglobin A1C: 6.4 % — AB (ref 4.0–5.6)

## 2021-06-21 LAB — POCT URINALYSIS DIP (CLINITEK)
Bilirubin, UA: NEGATIVE
Blood, UA: NEGATIVE
Glucose, UA: NEGATIVE mg/dL
Ketones, POC UA: NEGATIVE mg/dL
Leukocytes, UA: NEGATIVE
Nitrite, UA: NEGATIVE
POC PROTEIN,UA: NEGATIVE
Spec Grav, UA: >=1.03 — AB (ref 1.010–1.025)
Urobilinogen, UA: 0.2 E.U./dL
pH, UA: 5.5 (ref 5.0–8.0)

## 2021-06-21 MED ORDER — METFORMIN HCL 1000 MG PO TABS: 1000.0000 mg | ORAL_TABLET | Freq: Two times a day (BID) | ORAL | 3 refills | Status: DC

## 2021-06-21 MED ORDER — SIMVASTATIN 5 MG PO TABS: 5.0000 mg | ORAL_TABLET | Freq: Every day | ORAL | 3 refills | Status: DC

## 2021-06-21 NOTE — Progress Notes (Signed)
[address] [place], [place]  [address] Phone:  [phone]   Fax:  [phone]   Established Patient Office Visit  Subjective:  Patient ID: Tim Calderon, male    DOB: 1981-04-03  Age: 40 y.o. MRN: [id number]  CC:  Chief Complaint  Patient presents with   Follow-up    Follow up;Diabetes     HPI Tim Calderon presents for follow up. He  has a past medical history of Diabetes mellitus without complication ([place]) and Leukemia ([place]).   He is following up for DM. He reports his CBG 70-150. He denies any side effects of the Metformin. He continues to eat well and exercise with his children. 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  Denies headache, dizziness, visual changes, shortness of breath, dyspnea on exertion, chest pain, nausea, vomiting or any edema.    Past Medical History:  Diagnosis Date   Diabetes mellitus without complication (HCC)    Leukemia ([place])     Past Surgical History:  Procedure Laterality Date   CHEST SURGERY      Family History  Problem Relation Age of Onset   Diabetes Mother    Diabetes Father    Diabetes Sister    Diabetes Brother     Social History   Socioeconomic History   Marital status: Married    Spouse name: Not on file   Number of children: Not on file   Years of education: Not on file   Highest education level: Not on file  Occupational History   Not on file  Tobacco Use   Smoking status: Never   Smokeless tobacco: Never  Vaping Use   Vaping Use: Never used  Substance and Sexual Activity   Alcohol use: Not Currently    Comment: occ   Drug use: No   Sexual activity: Yes    Birth control/protection: None  Other Topics Concern   Not on file  Social History Narrative   Not on file   Social Determinants of Health   Financial Resource Strain: Not on file  Food Insecurity: Not on file  Transportation Needs: Not on file  Physical Activity: Not on file  Stress: Not on file  Social  Connections: Not on file  Intimate Partner Violence: Not on file    Outpatient Medications Prior to Visit  Medication Sig Dispense Refill   blood glucose meter kit and supplies KIT Dispense based on patient and insurance preference. Use up to four times daily as directed. 1 each 0   glucose blood test strip Use as instructed 100 each 12   metFORMIN (GLUCOPHAGE) 1000 MG tablet Take 1 tablet (1,000 mg total) by mouth 2 (two) times daily with a meal. 180 tablet 3   simvastatin (ZOCOR) 5 MG tablet Take 1 tablet (5 mg total) by mouth daily. 90 tablet 3   No facility-administered medications prior to visit.    Allergies  Allergen Reactions   Cytarabine Rash and Shortness Of Breath    Other reaction(s): Other (See Comments) [Rash] [Other] SOB, chest pain   Shrimp (Diagnostic) Anaphylaxis, Nausea And Vomiting and Rash   Vancomycin     Other reaction(s): Other (See Comments) Premed with Benadryl   Cefepime Rash   Ceftazidime Rash    ROS Review of Systems    Objective:    Physical Exam Constitutional:      Appearance: He is obese.  HENT:     Head: Normocephalic and atraumatic.  Nose: Nose normal.     Mouth/Throat:     Mouth: Mucous membranes are moist.  Cardiovascular:     Rate and Rhythm: Normal rate and regular rhythm.     Pulses: Normal pulses.     Heart sounds: Normal heart sounds.  Pulmonary:     Effort: Pulmonary effort is normal.     Breath sounds: Normal breath sounds.  Abdominal:     Palpations: Abdomen is soft.  Musculoskeletal:        General: Normal range of motion.     Cervical back: Normal range of motion.  Skin:    General: Skin is warm and dry.     Capillary Refill: Capillary refill takes less than 2 seconds.  Neurological:     General: No focal deficit present.     Mental Status: He is alert and oriented to person, place, and time.  Psychiatric:        Mood and Affect: Mood normal.        Behavior: Behavior normal.        Thought Content:  Thought content normal.        Judgment: Judgment normal.   BP 106/66   Pulse 69   Temp 98.1 F (36.7 C)   Ht _0  (1.6 m)   Wt 201 lb 12.8 oz (91.5 kg)   SpO2 98%   BMI 35.75 kg/m  Wt Readings from Last 3 Encounters:  06/21/21 201 lb 12.8 oz (91.5 kg)  05/06/21 203 lb 6.4 oz (92.3 kg)  04/08/21 201 lb 0.2 oz (91.2 kg)     Health Maintenance Due  Topic Date Due   COVID-19 Vaccine (4 - Booster for [place] series) 09/21/2020   INFLUENZA VACCINE  03/25/2021    There are no preventive care reminders to display for this patient.  Lab Results  Component Value Date   TSH 1.670 04/05/2018   Lab Results  Component Value Date   WBC 5.7 03/05/2021   HGB 15.4 03/05/2021   HCT 43.1 03/05/2021   MCV 92.9 03/05/2021   PLT 208 03/05/2021   Lab Results  Component Value Date   NA 140 04/08/2021   K 4.5 04/08/2021   CO2 25 03/05/2021   GLUCOSE 134 (H) 04/08/2021   BUN 12 04/08/2021   CREATININE 0.67 (L) 04/08/2021   BILITOT 0.4 04/08/2021   ALKPHOS 91 04/08/2021   AST 84 (H) 04/08/2021   ALT 247 (H) 03/05/2021   PROT 7.3 04/08/2021   ALBUMIN 4.4 04/08/2021   CALCIUM 9.0 04/08/2021   ANIONGAP 8 03/05/2021   EGFR 121 04/08/2021   Lab Results  Component Value Date   CHOL 172 04/08/2021   Lab Results  Component Value Date   HDL 44 04/08/2021   Lab Results  Component Value Date   LDLCALC 107 (H) 04/08/2021   Lab Results  Component Value Date   TRIG 116 04/08/2021   Lab Results  Component Value Date   CHOLHDL 3.9 04/08/2021   Lab Results  Component Value Date   HGBA1C 6.4 (A) 06/21/2021   HGBA1C 6.4 06/21/2021   HGBA1C 6.4 06/21/2021   HGBA1C 6.4 06/21/2021      Assessment & Plan:   Problem List Items Addressed This Visit       Other   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[place])   Other Visit Diagnoses     DM type 2, goal HbA1c < 7% ([place])  -  Primary Persistent however controlled A1C 6.4% Encourage compliance with current treatment regimen  Encourage regular  CBG monitoring Encourage contacting office if excessive hyperglycemia and or hypoglycemia Lifestyle modification with healthy diet (fewer calories, more high fiber foods, whole grains and non-starchy vegetables, lower fat meat and fish, low-fat diary include healthy oils) regular exercise (physical activity) and weight loss Opthalmology exam discussed  Nutritional consult recommended Regular dental visits encouraged Home BP monitoring also encouraged goal <130/80    Relevant Medications   metFORMIN (GLUCOPHAGE) 1000 MG tablet   simvastatin (ZOCOR) 5 MG tablet   Other Relevant Orders   HgB A1c (Completed)   POCT URINALYSIS DIP (CLINITEK) (Completed)   Comp. Metabolic Panel (12)   Lipid panel   Hemoglobin A1c less than 7.0%           Meds ordered this encounter  Medications   metFORMIN (GLUCOPHAGE) 1000 MG tablet    Sig: Take 1 tablet (1,000 mg total) by mouth 2 (two) times daily with a meal.    Dispense:  180 tablet    Refill:  3   simvastatin (ZOCOR) 5 MG tablet    Sig: Take 1 tablet (5 mg total) by mouth daily.    Dispense:  90 tablet    Refill:  3    Follow-up: Return in about 3 months (around 09/21/2021) for follow up DM [id number].    Vevelyn Francois, NP

## 2021-06-21 NOTE — Patient Instructions (Signed)
Diabetes mellitus y nutricin, en adultos Diabetes Mellitus and Nutrition, Adult Si sufre de diabetes, o diabetes mellitus, es muy importante tener hbitos alimenticios saludables debido a que sus niveles de Designer, television/film set sangre (glucosa) se ven afectados en gran medida por lo que come y bebe. Comer alimentos saludables en las cantidades correctas, aproximadamente a la misma hora todos los [place], [place] ayudar a: Aeronautical engineer glucemia. Disminuir el riesgo de sufrir una enfermedad cardaca. Mejorar la presin arterial. Science writer o mantener un peso saludable. Qu puede afectar mi plan de alimentacin? Todas las personas que sufren de diabetes son diferentes y cada una tiene necesidades diferentes en cuanto a un plan de alimentacin. El mdico puede recomendarle que trabaje con un nutricionista para elaborar el mejor plan para usted. Su plan de alimentacin puede variar segn factores como: Las caloras que necesita. Los medicamentos que toma. Su peso. Sus niveles de glucemia, presin arterial y colesterol. Su nivel de [place]. Otras afecciones que tenga, como enfermedades cardacas o renales. Cmo me afectan los carbohidratos? Los carbohidratos, o hidratos de carbono, afectan su nivel de glucemia ms que cualquier otro tipo de alimento. La ingesta de carbohidratos naturalmente aumenta la cantidad de [employer]. El recuento de carbohidratos es un mtodo destinado a Catering manager un registro de la cantidad de carbohidratos que se consumen. El recuento de carbohidratos es importante para Theatre manager la glucemia a un nivel saludable, especialmente si utiliza insulina o toma determinados medicamentos por va oral para la diabetes. Es importante conocer la cantidad de carbohidratos que se pueden ingerir en cada comida sin correr Engineer, manufacturing. Esto es Psychologist, forensic. Su nutricionista puede ayudarlo a calcular la cantidad de carbohidratos que debe ingerir en cada comida y en cada refrigerio. Cmo  me afecta el alcohol? El alcohol puede provocar disminuciones sbitas de la glucemia (hipoglucemia), especialmente si utiliza insulina o toma determinados medicamentos por va oral para la diabetes. La hipoglucemia es una afeccin potencialmente mortal. Los sntomas de la hipoglucemia, como somnolencia, mareos y confusin, son similares a los sntomas de haber consumido demasiado alcohol. No beba alcohol si: Su mdico le indica no hacerlo. Est embarazada, puede estar embarazada o est tratando de [place]. Si bebe alcohol: No beba con el estmago vaco. Limite la cantidad que bebe: De 0 a 1 medida por da para las mujeres. De 0 a 2 medidas por da para los hombres. Est atento a la cantidad de alcohol que hay en las bebidas que toma. En los [place], una medida equivale a una botella de cerveza de 12 oz (355 ml), un vaso de vino de 5 oz (148 ml) o un vaso de una bebida alcohlica de alta graduacin de 1 oz (44 ml). Mantngase hidratado bebiendo agua, refrescos dietticos o t helado sin azcar. Tenga en cuenta que los refrescos comunes, los jugos y otras bebida para Optician, dispensing pueden contener mucha azcar y se deben contar como carbohidratos. Consejos para seguir Photographer las etiquetas de los alimentos Comience por leer el tamao de la porcin en la "Informacin nutricional" en las etiquetas de los alimentos envasados y las bebidas. La cantidad de caloras, carbohidratos, grasas y otros nutrientes mencionados en la etiqueta se basan en una porcin del alimento. Muchos alimentos contienen ms de una porcin por envase. Verifique la cantidad total de gramos (g) de carbohidratos totales en una porcin. Puede calcular la cantidad de porciones de carbohidratos al dividir el total de carbohidratos por 15. Por ejemplo, si un alimento tiene un  total de 30 g de carbohidratos totales por porcin, equivale a 2 porciones de carbohidratos. Verifique la cantidad de gramos (g) de grasas  saturadas y grasas trans de una porcin. Escoja alimentos que no contengan estas grasas o que su contenido de estas sea [place]. Verifique la cantidad de miligramos (mg) de sal (sodio) en una porcin. La [employer] de las personas deben limitar la ingesta de sodio total a menos de 2300 mg Honeywell. Siempre consulte la informacin nutricional de los alimentos etiquetados como "con bajo contenido de grasa" o "sin grasa". Estos alimentos pueden tener un mayor contenido de Location manager agregada o carbohidratos refinados, y deben evitarse. Hable con su nutricionista para identificar sus objetivos diarios en cuanto a los nutrientes mencionados en la etiqueta. Al ir de compras Evite comprar alimentos procesados, enlatados o precocidos. Estos alimentos tienden a Special educational needs teacher mayor cantidad de [place], sodio y azcar agregada. Compre en la zona exterior de la tienda de comestibles. Esta es la zona donde se encuentran con mayor frecuencia las frutas y las verduras frescas, los cereales a granel, las carnes frescas y los productos lcteos frescos. Al cocinar Utilice mtodos de coccin a baja temperatura, como hornear, en lugar de mtodos de coccin a alta temperatura, como frer en abundante aceite. Cocine con aceites saludables, como el aceite de [place], canola o [place]. Evite cocinar con manteca, crema o carnes con alto contenido de grasa. Planificacin de las comidas Coma las comidas y los refrigerios regularmente, preferentemente a la misma hora todos [place]. Evite pasar largos perodos de tiempo sin comer. Consuma alimentos ricos en fibra, como frutas frescas, verduras, frijoles y cereales integrales. Consulte a su nutricionista sobre cuntas porciones de carbohidratos puede consumir en cada comida. Consuma entre 4 y 6 onzas (entre 112 y 168 g) de protenas magras por da, como carnes [place], pollo, pescado, huevos o tofu. Una onza (oz) de protena magra equivale a: 1 onza (28 g) de carne, pollo o pescado. 1 huevo.  de  taza (62 g) de tofu. Coma algunos alimentos por da que contengan grasas saludables, como aguacates, frutos secos, semillas y pescado. Qu alimentos debo comer? Lambert Mody Bayas. Manzanas. Naranjas. Duraznos. Damascos. Ciruelas. Uvas. Mango. Papaya. [place]. Kiwi. Cerezas. [place] Valeda Malm. [place]spinaca. Verduras de Boeing, que incluyen col rizada, [place], hojas de [place] y de [place]. Remolachas. Coliflor. Repollo. Brcoli. Zanahorias. Judas verdes. Tomates. Pimientos. Cebollas. Pepinos. Coles de Bruselas. Granos Granos integrales, como panes, galletas, tortillas, cereales y pastas de salvado o integrales. Avena sin azcar. Quinua. Arroz integral o salvaje. Carnes y Psychiatric nurse. Carne de ave sin piel. Cortes magros de ave y carne de res. Tofu. Frutos secos. Semillas. Lcteos Productos lcteos sin grasa o con bajo contenido de [place], [place], yogur y [place]. Es posible que los productos que se enumeran ms [place] no constituyan una lista completa de los alimentos y las bebidas que puede tomar. Consulte a un nutricionista para obtener ms informacin. Qu alimentos debo evitar? Lambert Mody Frutas enlatadas al almbar. Verduras Verduras enlatadas. Verduras congeladas con mantequilla o salsa de crema. Granos Productos elaborados con [place] y [place], como panes, pastas, bocadillos y cereales. Evite todos los alimentos procesados. Carnes y otras protenas Cortes de carne con alto contenido de Lobbyist. Carne de ave con piel. Carnes empanizadas o fritas. Carne procesada. Evite las grasas saturadas. Lcteos Yogur, [place] enteros. Bebidas Bebidas azucaradas, como gaseosas o t helado. Es posible que los productos que se enumeran ms [place] no constituyan una lista [place] de  los alimentos y las bebidas que debe evitar. Consulte a un nutricionista para obtener ms informacin. Preguntas para hacerle al mdico Es necesario que me rena con Radio broadcast assistant en el cuidado  de la diabetes? Es necesario que me rena con un nutricionista? A qu nmero puedo llamar si tengo preguntas? Cules son los mejores momentos para controlar la glucemia? Dnde encontrar ms informacin: Asociacin Estadounidense de la Diabetes (American Diabetes Association): diabetes.org Academy of Nutrition and Dietetics (Academia de Nutricin y Information systems manager): www.eatright.Unisys Corporation of Diabetes and Digestive and Kidney Diseases ([place] la Diabetes y [place] y [place]): DesMoinesFuneral.dk Association of Diabetes Care and Education Specialists (Asociacin de Especialistas en Atencin y Educacin sobre la Diabetes): www.diabeteseducator.org Resumen Es importante tener hbitos alimenticios saludables debido a que sus niveles de Designer, television/film set sangre (glucosa) se ven afectados en gran medida por lo que come y bebe. Un plan de alimentacin saludable lo ayudar a controlar la glucemia y Theatre manager un estilo de vida saludable. El mdico puede recomendarle que trabaje con un nutricionista para elaborar el mejor plan para usted. Tenga en cuenta que los carbohidratos (hidratos de carbono) y el alcohol tienen efectos inmediatos en sus niveles de glucemia. Es importante contar los carbohidratos que ingiere y consumir alcohol con prudencia. Esta informacin no tiene Marine scientist el consejo del mdico. Asegrese de hacerle al mdico cualquier pregunta que tenga. Document Revised: 09/15/2019 Document Reviewed: 09/15/2019 Elsevier Patient Education  2021 [employer].

## 2021-06-22 LAB — LIPID PANEL
Chol/HDL Ratio: 3.7 ratio (ref 0.0–5.0)
Cholesterol, Total: 144 mg/dL (ref 100–199)
HDL: 39 mg/dL — ABNORMAL LOW (ref >39)
LDL Chol Calc (NIH): 83 mg/dL (ref 0–99)
Triglycerides: 119 mg/dL (ref 0–149)
VLDL Cholesterol Cal: 22 mg/dL (ref 5–40)

## 2021-06-22 LAB — COMP. METABOLIC PANEL (12)
AST: 111 IU/L — ABNORMAL HIGH (ref 0–40)
Albumin/Globulin Ratio: 1.6 (ref 1.2–2.2)
Albumin: 4.5 g/dL (ref 4.0–5.0)
Alkaline Phosphatase: 62 IU/L (ref 44–121)
BUN/Creatinine Ratio: 18 (ref 9–20)
BUN: 14 mg/dL (ref 6–24)
Bilirubin Total: 0.4 mg/dL (ref 0.0–1.2)
Calcium: 9.1 mg/dL (ref 8.7–10.2)
Chloride: 106 mmol/L (ref 96–106)
Creatinine, Ser: 0.8 mg/dL (ref 0.76–1.27)
Globulin, Total: 2.9 g/dL (ref 1.5–4.5)
Glucose: 104 mg/dL — ABNORMAL HIGH (ref 70–99)
Potassium: 4.8 mmol/L (ref 3.5–5.2)
Sodium: 142 mmol/L (ref 134–144)
Total Protein: 7.4 g/dL (ref 6.0–8.5)
eGFR: 115 mL/min/{1.73_m2} (ref >59)

## 2021-06-28 DIAGNOSIS — Z888 Allergy status to other drugs, medicaments and biological substances status: Secondary | ICD-10-CM | POA: Diagnosis not present

## 2021-06-28 DIAGNOSIS — C9201 Acute myeloblastic leukemia, in remission: Secondary | ICD-10-CM | POA: Diagnosis not present

## 2021-06-28 DIAGNOSIS — Z881 Allergy status to other antibiotic agents status: Secondary | ICD-10-CM | POA: Diagnosis not present

## 2021-06-28 DIAGNOSIS — Z9481 Bone marrow transplant status: Secondary | ICD-10-CM | POA: Diagnosis not present

## 2021-08-21 IMAGING — CT CT ABD-PELV W/ CM
2 of 5 series · 16 of 46 positions shown, 18 images · IV contrast (omnipaque)
Comparison: None.

CLINICAL DATA: Diverticulitis suspected, diarrhea x3 days.

EXAM:
CT ABDOMEN AND PELVIS WITH CONTRAST
TECHNIQUE: Multidetector CT imaging of the abdomen and pelvis was performed
using the standard protocol following bolus administration of
intravenous contrast.
CONTRAST:  100mL OMNIPAQUE IOHEXOL 300 MG/ML  SOLN

[Series 3: a/p w/ 5mm · axial · 0.82mm/px · z∈[+811,+1236]mm · 13 of 97 slices shown, 15 images]
[im 6/97  soft-tissue]
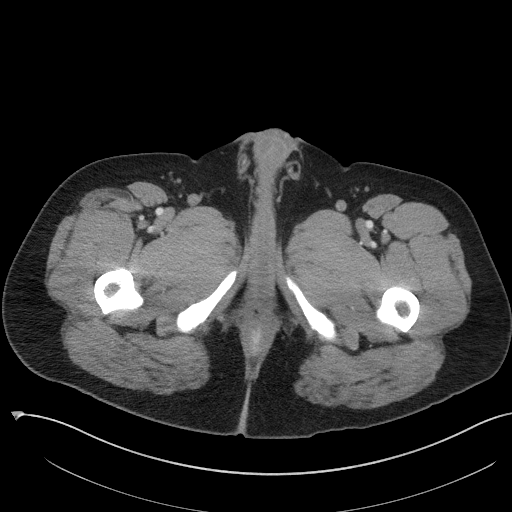
[im 6/97  bone]
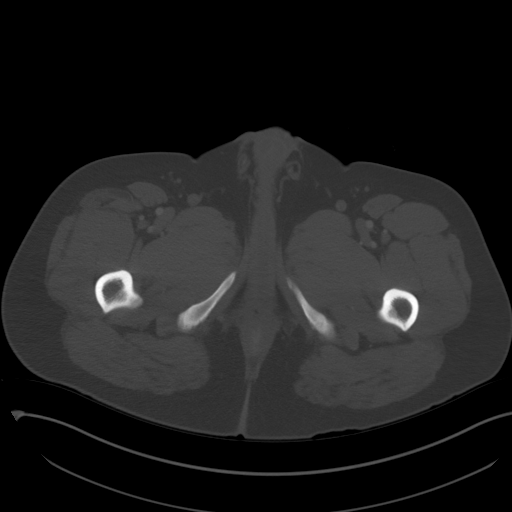
[im 12/97  soft-tissue]
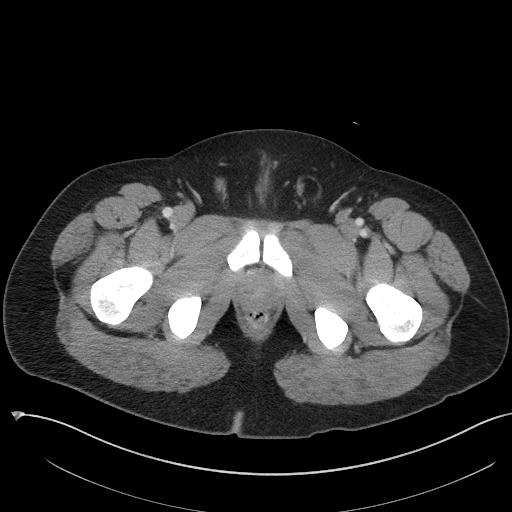
[im 23/97  soft-tissue]
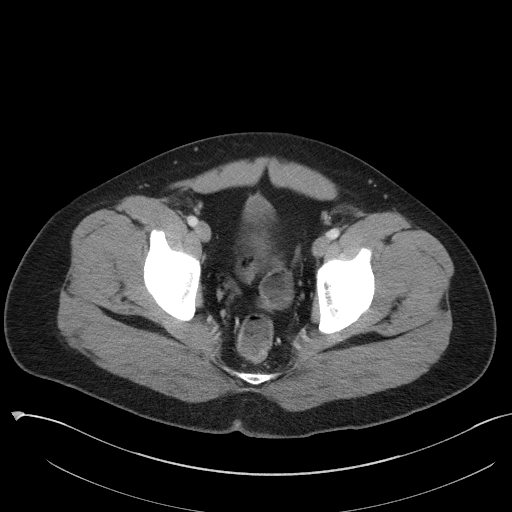
[im 29/97  soft-tissue]
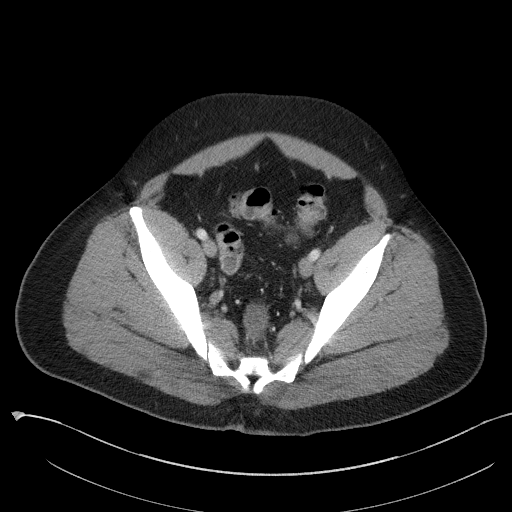
[im 34/97  soft-tissue]
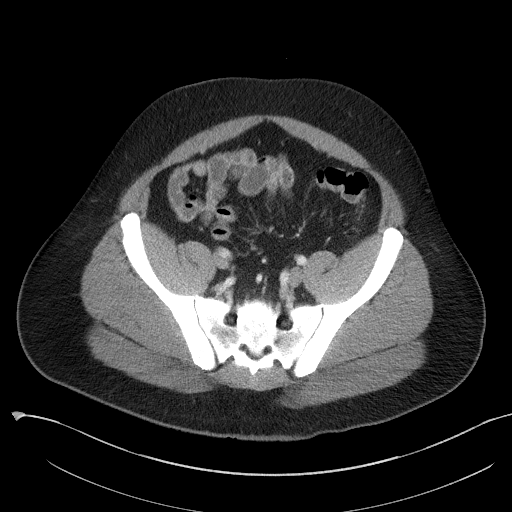
[im 40/97  soft-tissue]
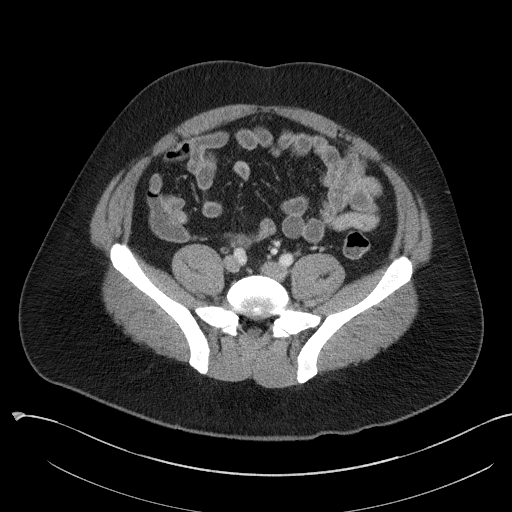
[im 51/97  soft-tissue]
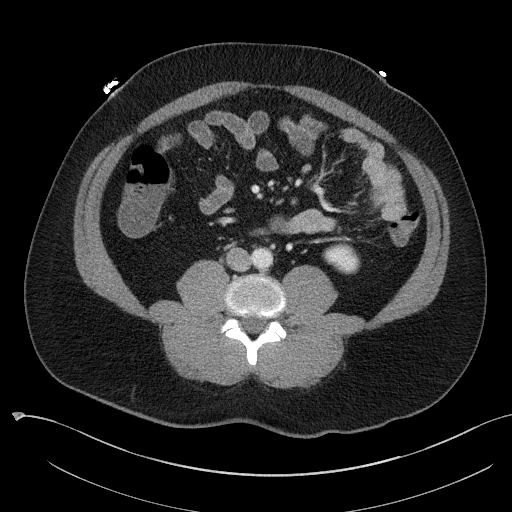
[im 57/97  soft-tissue]
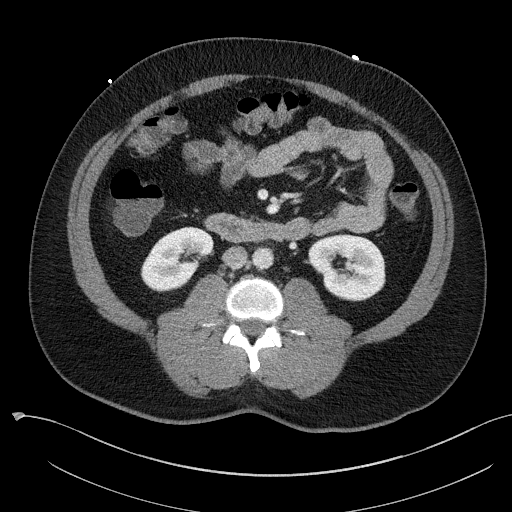
[im 63/97  soft-tissue]
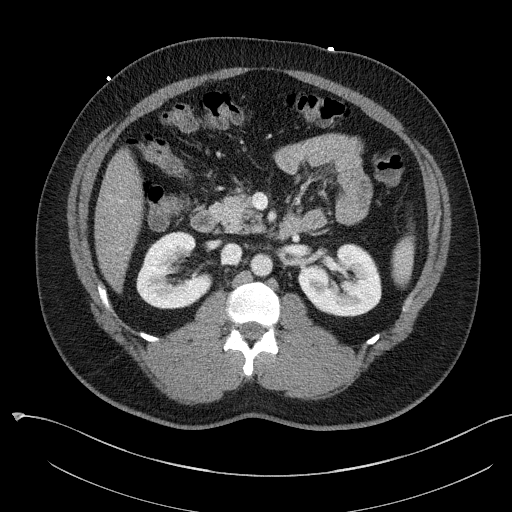
[im 63/97  bone]
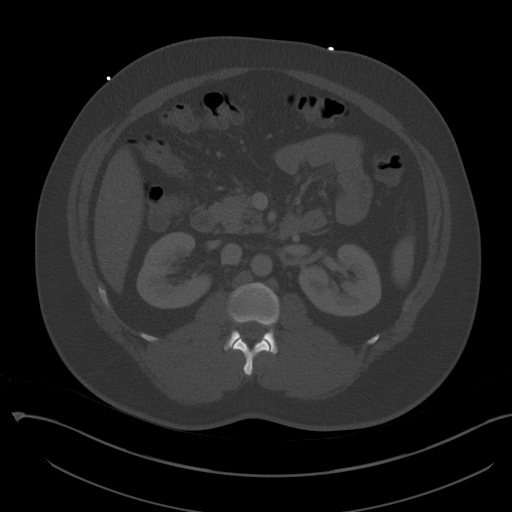
[im 68/97  soft-tissue]
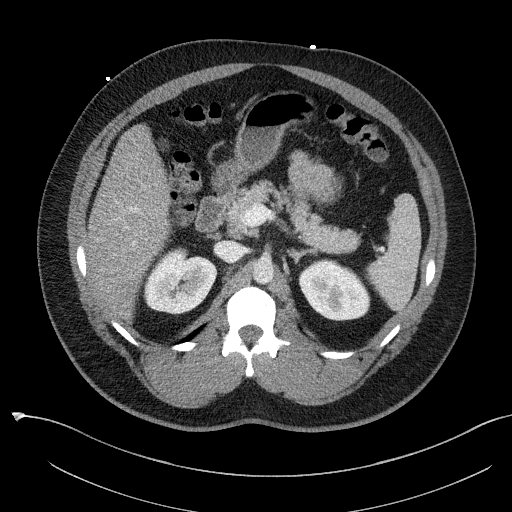
[im 74/97  soft-tissue]
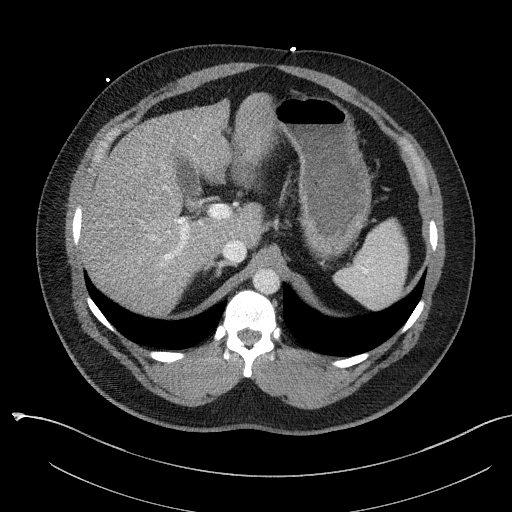
[im 85/97  soft-tissue]
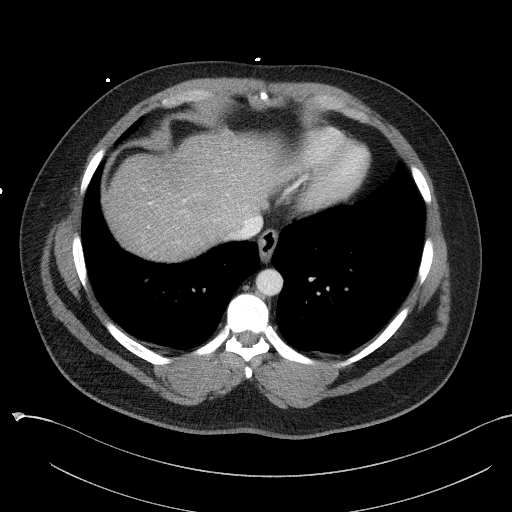
[im 91/97  soft-tissue]
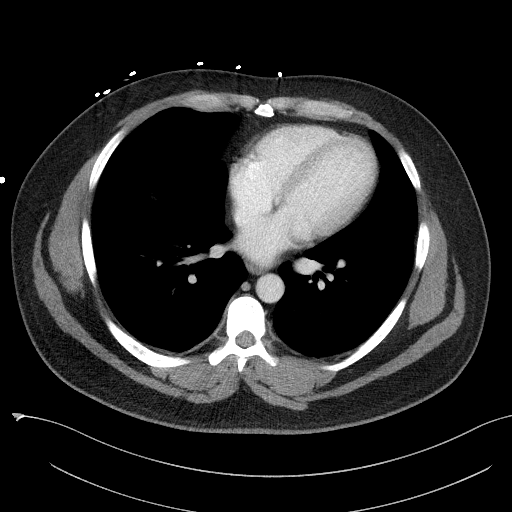

[Series 6: a/p w/ cor · coronal · 0.87mm/px · 3 of 185 slices shown]
[im 62/185  soft-tissue]
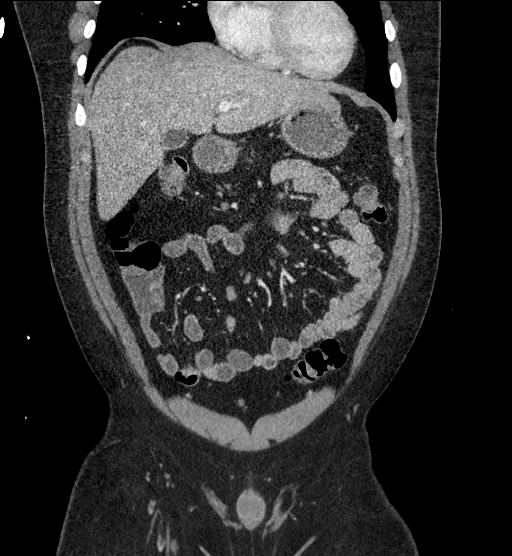
[im 82/185  soft-tissue]
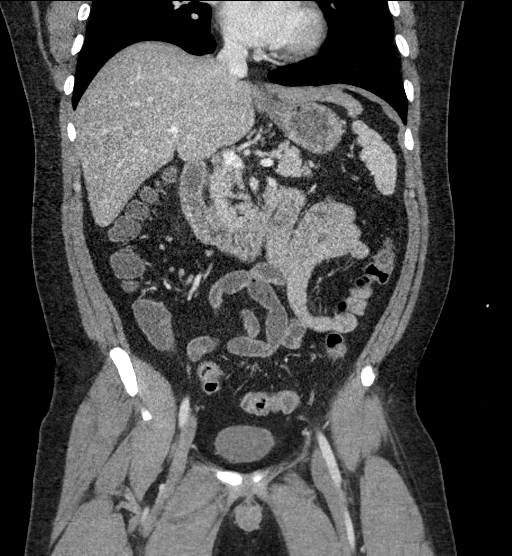
[im 103/185  soft-tissue]
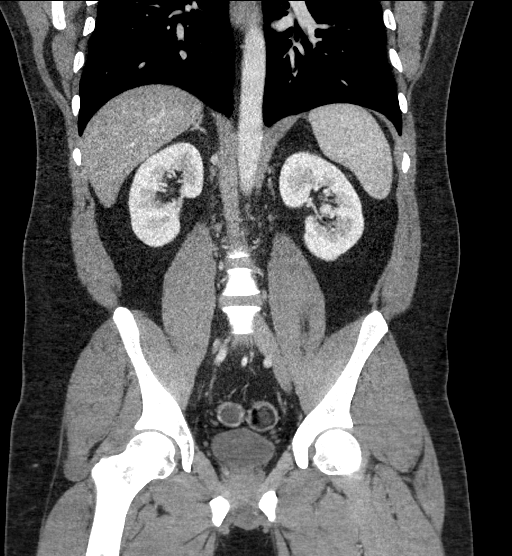

[16 of 46 positions shown; findings below may reference images not displayed]

FINDINGS: Lower chest: No acute abnormality. Normal size heart. No pericardial
effusion.

Hepatobiliary: Mild diffuse hepatic steatosis. Gallbladder is
unremarkable. No biliary ductal dilation.

Pancreas: Unremarkable. No pancreatic ductal dilatation or
surrounding inflammatory changes.

Spleen: Unremarkable

Adrenals/Urinary Tract: Bilateral adrenal glands are unremarkable.
No hydronephrosis. No suspicious renal masses. No filling defect
visualized in the opacified portions of the collecting system and
ureter on delayed imaging. Urinary bladder is unremarkable for
degree of distension.

Stomach/Bowel: Stomach is unremarkable. Normal positioning of the
duodenum/ligament of [place]. No suspicious small bowel wall
thickening or dilation. The appendix is not definitely visualized
however there is no inflammatory stranding along the cecum or in the
right lower quadrant. Colonic diverticulosis without acute
inflammation to suggest diverticulitis. Gas fluid levels throughout
the colon.

Vascular/Lymphatic: No significant vascular findings are present. No
enlarged abdominal or pelvic lymph nodes.

Reproductive: Prostate is unremarkable.

Other: Asymmetric increased fat in the left inguinal canal.

Musculoskeletal: No acute osseous abnormality.
IMPRESSION: 1. Colonic diverticulosis without evidence of acute diverticulitis.
2. Gas fluid levels throughout the colon suggestive of diarrheal
illness.
3. Mild diffuse hepatic steatosis.

## 2021-09-23 ENCOUNTER — Ambulatory Visit (INDEPENDENT_AMBULATORY_CARE_PROVIDER_SITE_OTHER): Payer: BC Managed Care – PPO | Admitting: Nurse Practitioner

## 2021-09-23 ENCOUNTER — Other Ambulatory Visit: Payer: Self-pay

## 2021-09-23 ENCOUNTER — Encounter: Payer: Self-pay | Admitting: Nurse Practitioner

## 2021-09-23 VITALS — BP 108/70 | HR 75 | Temp 98.1°F | Ht 63.0 in | Wt 199.2 lb

## 2021-09-23 DIAGNOSIS — E782 Mixed hyperlipidemia: Secondary | ICD-10-CM | POA: Diagnosis not present

## 2021-09-23 DIAGNOSIS — E1165 Type 2 diabetes mellitus with hyperglycemia: Secondary | ICD-10-CM

## 2021-09-23 DIAGNOSIS — C9201 Acute myeloblastic leukemia, in remission: Secondary | ICD-10-CM

## 2021-09-23 LAB — POCT URINALYSIS DIP (CLINITEK)
Bilirubin, UA: NEGATIVE
Blood, UA: NEGATIVE
Glucose, UA: NEGATIVE mg/dL
Ketones, POC UA: NEGATIVE mg/dL
Leukocytes, UA: NEGATIVE
Nitrite, UA: NEGATIVE
POC PROTEIN,UA: NEGATIVE
Spec Grav, UA: 1.025 (ref 1.010–1.025)
Urobilinogen, UA: 0.2 E.U./dL
pH, UA: 5.5 (ref 5.0–8.0)

## 2021-09-23 LAB — POCT GLYCOSYLATED HEMOGLOBIN (HGB A1C)
HbA1c POC (<> result, manual entry): 6.2 % (ref 4.0–5.6)
HbA1c, POC (controlled diabetic range): 6.2 % (ref 0.0–7.0)
HbA1c, POC (prediabetic range): 6.2 % (ref 5.7–6.4)
Hemoglobin A1C: 6.2 % — AB (ref 4.0–5.6)

## 2021-09-23 NOTE — Patient Instructions (Signed)

## 2021-09-23 NOTE — Progress Notes (Signed)
[place], [place]  [address] Phone:  [phone]   Fax:  [phone]   Established Patient Office Visit  Subjective:  Patient ID: Tim Calderon, male    DOB: 11/11/1980  Age: 41 y.o. MRN: [id number]  CC:  Chief Complaint  Patient presents with   Follow-up    Pt is here today for her 3 month follow up visit. Pt states that he has been having pains in his right side and he is not sure if he pulled a muscle or if it is something else going on. Pt states it started 2 weeks ago.    HPI Tim Calderon presents for follow up. He  has a past medical history of Diabetes mellitus without complication ([place]) and Leukemia ([place]).   He is following for DM . He is currently on Metformin 1000 mg BID. He is compliant with his medications. He reports that his CBG was 80 to 166. He dose continue to exercise and is compliant with his diet. He continues to loose weight; 3 pounds. Denies fever, chills, headache, dizziness, visual changes, polydipsia,  polyphagia shortness of breath, dyspnea on exertion, chest pain, nausea, vomiting, polyuria, constipation, diarrhea, or any edema.   He has had RLQ abdominal pain. He reports that today he feels nothing. He reports regular BM , no constipation or diarrhea and no nausea or vomiting. It maybe related to lifting.  Past Medical History:  Diagnosis Date   Diabetes mellitus without complication (HCC)    Leukemia ([place])     Past Surgical History:  Procedure Laterality Date   CHEST SURGERY      Family History  Problem Relation Age of Onset   Diabetes Mother    Diabetes Father    Diabetes Sister    Diabetes Brother     Social History   Socioeconomic History   Marital status: Married    Spouse name: Not on file   Number of children: Not on file   Years of education: Not on file   Highest education level: Not on file  Occupational History   Not on file  Tobacco Use   Smoking status: Never    Smokeless tobacco: Never  Vaping Use   Vaping Use: Never used  Substance and Sexual Activity   Alcohol use: Yes    Comment: occ   Drug use: No   Sexual activity: Yes    Birth control/protection: None  Other Topics Concern   Not on file  Social History Narrative   Not on file   Social Determinants of Health   Financial Resource Strain: Not on file  Food Insecurity: Not on file  Transportation Needs: Not on file  Physical Activity: Not on file  Stress: Not on file  Social Connections: Not on file  Intimate Partner Violence: Not on file    Outpatient Medications Prior to Visit  Medication Sig Dispense Refill   blood glucose meter kit and supplies KIT Dispense based on patient and insurance preference. Use up to four times daily as directed. 1 each 0   glucose blood test strip Use as instructed 100 each 12   metFORMIN (GLUCOPHAGE) 1000 MG tablet Take 1 tablet (1,000 mg total) by mouth 2 (two) times daily with a meal. 180 tablet 3   simvastatin (ZOCOR) 5 MG tablet Take 1 tablet (5 mg total) by mouth daily. 90 tablet 3   No facility-administered medications prior to visit.    Allergies  Allergen Reactions   Cytarabine Rash and Shortness Of Breath    Other reaction(s): Other (See Comments) [Rash] [Other] SOB, chest pain   Shrimp (Diagnostic) Anaphylaxis, Nausea And Vomiting and Rash   Vancomycin     Other reaction(s): Other (See Comments) Premed with Benadryl   Cefepime Rash   Ceftazidime Rash   Shellfish-Derived Products Rash    ROS Review of Systems    Objective:    Physical Exam Constitutional:      Appearance: He is obese.  HENT:     Head: Normocephalic and atraumatic.     Nose: Nose normal.     Mouth/Throat:     Mouth: Mucous membranes are moist.  Cardiovascular:     Rate and Rhythm: Normal rate and regular rhythm.     Pulses: Normal pulses.     Heart sounds: Normal heart sounds.  Pulmonary:     Effort: Pulmonary effort is normal.     Breath sounds:  Normal breath sounds.  Abdominal:     Palpations: Abdomen is soft.  Musculoskeletal:        General: Normal range of motion.     Cervical back: Normal range of motion.  Skin:    General: Skin is warm and dry.     Capillary Refill: Capillary refill takes less than 2 seconds.  Neurological:     General: No focal deficit present.     Mental Status: He is alert and oriented to person, place, and time.  Psychiatric:        Mood and Affect: Mood normal.        Behavior: Behavior normal.        Thought Content: Thought content normal.        Judgment: Judgment normal.    BP 108/70    Pulse 75    Temp 98.1 F (36.7 C)    Ht '5\' 3"'  (1.6 m)    Wt 199 lb 3.2 oz (90.4 kg)    SpO2 95%    BMI 35.29 kg/m  Wt Readings from Last 3 Encounters:  09/23/21 199 lb 3.2 oz (90.4 kg)  06/21/21 201 lb 12.8 oz (91.5 kg)  05/06/21 203 lb 6.4 oz (92.3 kg)     There are no preventive care reminders to display for this patient.   There are no preventive care reminders to display for this patient.  Lab Results  Component Value Date   TSH 1.670 04/05/2018   Lab Results  Component Value Date   WBC 5.7 03/05/2021   HGB 15.4 03/05/2021   HCT 43.1 03/05/2021   MCV 92.9 03/05/2021   PLT 208 03/05/2021   Lab Results  Component Value Date   NA 142 06/21/2021   K 4.8 06/21/2021   CO2 25 03/05/2021   GLUCOSE 104 (H) 06/21/2021   BUN 14 06/21/2021   CREATININE 0.80 06/21/2021   BILITOT 0.4 06/21/2021   ALKPHOS 62 06/21/2021   AST 111 (H) 06/21/2021   ALT 247 (H) 03/05/2021   PROT 7.4 06/21/2021   ALBUMIN 4.5 06/21/2021   CALCIUM 9.1 06/21/2021   ANIONGAP 8 03/05/2021   EGFR 115 06/21/2021   Lab Results  Component Value Date   CHOL 144 06/21/2021   Lab Results  Component Value Date   HDL 39 (L) 06/21/2021   Lab Results  Component Value Date   LDLCALC 83 06/21/2021   Lab Results  Component Value Date   TRIG 119 06/21/2021   Lab Results  Component Value Date  CHOLHDL 3.7  06/21/2021   Lab Results  Component Value Date   HGBA1C 6.2 (A) 09/23/2021   HGBA1C 6.2 09/23/2021   HGBA1C 6.2 09/23/2021   HGBA1C 6.2 09/23/2021      Assessment & Plan:   Problem List Items Addressed This Visit       Other   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  Continue encourage regular CBG monitoring Encourage contacting office if excessive hyperglycemia and or hypoglycemia Continue lifestyle modification with healthy diet (fewer calories, more high fiber foods, whole grains and non-starchy vegetables, lower fat meat and fish, low-fat diary include healthy oils) regular exercise (physical activity) and weight loss Opthalmology exam discussed  Regular dental visits encouraged Home BP monitoring also encouraged goal <130/80    Relevant Orders   HgB A1c (Completed)   POCT URINALYSIS DIP (CLINITEK) (Completed)   Comp. Metabolic Panel (12)   Mixed hyperlipidemia     Stable   Relevant Orders   Lipid panel       No orders of the defined types were placed in this encounter.   Follow-up: Return in about 3 months (around 12/22/2021) for follow up DM [id number].    Vevelyn Francois, NP

## 2021-09-24 ENCOUNTER — Encounter: Payer: Self-pay | Admitting: Nurse Practitioner

## 2021-09-24 LAB — LIPID PANEL
Chol/HDL Ratio: 4.2 ratio (ref 0.0–5.0)
Cholesterol, Total: 161 mg/dL (ref 100–199)
HDL: 38 mg/dL — ABNORMAL LOW (ref >39)
LDL Chol Calc (NIH): 101 mg/dL — ABNORMAL HIGH (ref 0–99)
Triglycerides: 123 mg/dL (ref 0–149)
VLDL Cholesterol Cal: 22 mg/dL (ref 5–40)

## 2021-09-24 LAB — COMP. METABOLIC PANEL (12)
AST: 72 IU/L — ABNORMAL HIGH (ref 0–40)
Albumin/Globulin Ratio: 1.6 (ref 1.2–2.2)
Albumin: 4.6 g/dL (ref 4.0–5.0)
Alkaline Phosphatase: 62 IU/L (ref 44–121)
BUN/Creatinine Ratio: 23 — ABNORMAL HIGH (ref 9–20)
BUN: 17 mg/dL (ref 6–24)
Bilirubin Total: 0.3 mg/dL (ref 0.0–1.2)
Calcium: 9.3 mg/dL (ref 8.7–10.2)
Chloride: 105 mmol/L (ref 96–106)
Creatinine, Ser: 0.75 mg/dL — ABNORMAL LOW (ref 0.76–1.27)
Globulin, Total: 2.9 g/dL (ref 1.5–4.5)
Glucose: 100 mg/dL — ABNORMAL HIGH (ref 70–99)
Potassium: 4.5 mmol/L (ref 3.5–5.2)
Sodium: 141 mmol/L (ref 134–144)
Total Protein: 7.5 g/dL (ref 6.0–8.5)
eGFR: 116 mL/min/{1.73_m2} (ref >59)

## 2021-12-23 ENCOUNTER — Ambulatory Visit: Payer: BC Managed Care – PPO | Admitting: Nurse Practitioner

## 2021-12-23 ENCOUNTER — Encounter: Payer: Self-pay | Admitting: Nurse Practitioner

## 2021-12-23 ENCOUNTER — Ambulatory Visit (INDEPENDENT_AMBULATORY_CARE_PROVIDER_SITE_OTHER): Payer: BC Managed Care – PPO | Admitting: Nurse Practitioner

## 2021-12-23 VITALS — BP 110/74 | HR 75 | Resp 18 | Ht 63.0 in | Wt 201.6 lb

## 2021-12-23 DIAGNOSIS — E119 Type 2 diabetes mellitus without complications: Secondary | ICD-10-CM | POA: Diagnosis not present

## 2021-12-23 DIAGNOSIS — E1165 Type 2 diabetes mellitus with hyperglycemia: Secondary | ICD-10-CM | POA: Diagnosis not present

## 2021-12-23 LAB — POCT GLYCOSYLATED HEMOGLOBIN (HGB A1C)
HbA1c POC (<> result, manual entry): 6.1 % (ref 4.0–5.6)
HbA1c, POC (controlled diabetic range): 6.1 % (ref 0.0–7.0)
HbA1c, POC (prediabetic range): 6.1 % (ref 5.7–6.4)
Hemoglobin A1C: 6.1 % — AB (ref 4.0–5.6)

## 2021-12-23 MED ORDER — METFORMIN HCL 1000 MG PO TABS: 1000.0000 mg | ORAL_TABLET | Freq: Two times a day (BID) | ORAL | 3 refills | Status: DC

## 2021-12-23 MED ORDER — SIMVASTATIN 5 MG PO TABS: 5.0000 mg | ORAL_TABLET | Freq: Every day | ORAL | 3 refills | Status: DC

## 2021-12-23 MED ORDER — GLUCOSE BLOOD VI STRP: ORAL_STRIP | 12 refills | Status: AC

## 2021-12-23 NOTE — Progress Notes (Signed)
'@Patient'  ID: Tim Calderon, male    DOB: [birth year], 41 y.o.   MRN: [id number] ? ?Chief Complaint  ?Patient presents with  ? Follow-up  ?  Diabetes ?  ? ? ?Referring provider: ?Vevelyn Francois, NP ? ? ?HPI ? ?Patient presents today for diabetes follow-up.  Patient states that he does check his blood sugars at home.  His blood sugar has been ranging 100-110.  Patient does try to eat a healthy diet.  Overall has been doing well with no new issues or concerns. Denies f/c/s, n/v/d, hemoptysis, PND, chest pain or edema. ? ? ? ? ?Allergies  ?Allergen Reactions  ? Cytarabine Rash and Shortness Of Breath  ?  Other reaction(s): Other (See Comments) ?[Rash] [Other] SOB, chest pain  ? Shrimp (Diagnostic) Anaphylaxis, Nausea And Vomiting and Rash  ? Vancomycin   ?  Other reaction(s): Other (See Comments) ?Premed with Benadryl  ? Cefepime Rash  ? Ceftazidime Rash  ? Shellfish-Derived Products Rash  ? ? ?Immunization History  ?Administered Date(s) Administered  ? Influenza Split 07/23/2011, 06/10/2019  ? Influenza,inj,Quad PF,6+ Mos 05/26/2017, 06/21/2021  ? Influenza,inj,Quad PF,6-35 Mos 06/17/2019  ? Influenza,inj,quad, With Preservative 06/26/2014  ? MMR 12/27/2012  ? PFIZER(Purple Top)SARS-COV-2 Vaccination 10/27/2019, 12/04/2019, 07/27/2020  ? Pneumococcal Polysaccharide-23 07/23/2011, 12/27/2012, 03/06/2021  ? Tdap 01/07/2012, 05/26/2017  ? ? ?Past Medical History:  ?Diagnosis Date  ? Diabetes mellitus without complication ([place])   ? Leukemia ([place])   ? ? ?Tobacco History: ?Social History  ? ?Tobacco Use  ?Smoking Status Never  ?Smokeless Tobacco Never  ? ?Counseling given: Not Answered ? ? ?Outpatient Encounter Medications as of 12/23/2021  ?Medication Sig  ? blood glucose meter kit and supplies KIT Dispense based on patient and insurance preference. Use up to four times daily as directed.  ? glucose blood test strip Use as instructed  ? metFORMIN (GLUCOPHAGE) 1000 MG tablet Take 1 tablet (1,000 mg total) by mouth 2 (two)  times daily with a meal.  ? simvastatin (ZOCOR) 5 MG tablet Take 1 tablet (5 mg total) by mouth daily.  ? [DISCONTINUED] glucose blood test strip Use as instructed  ? [DISCONTINUED] metFORMIN (GLUCOPHAGE) 1000 MG tablet Take 1 tablet (1,000 mg total) by mouth 2 (two) times daily with a meal.  ? [DISCONTINUED] simvastatin (ZOCOR) 5 MG tablet Take 1 tablet (5 mg total) by mouth daily.  ? ?No facility-administered encounter medications on file as of 12/23/2021.  ? ? ? ?Review of Systems ? ?Review of Systems  ?Constitutional: Negative.   ?HENT: Negative.    ?Cardiovascular: Negative.   ?Gastrointestinal: Negative.   ?Allergic/Immunologic: Negative.   ?Neurological: Negative.   ?Psychiatric/Behavioral: Negative.     ? ? ? ?Physical Exam ? ?BP 110/74   Pulse 75   Resp 18   Ht '5\' 3"'  (1.6 m)   Wt 201 lb 9.6 oz (91.4 kg)   SpO2 98%   BMI 35.71 kg/m?  ? ?Wt Readings from Last 5 Encounters:  ?12/23/21 201 lb 9.6 oz (91.4 kg)  ?09/23/21 199 lb 3.2 oz (90.4 kg)  ?06/21/21 201 lb 12.8 oz (91.5 kg)  ?05/06/21 203 lb 6.4 oz (92.3 kg)  ?04/08/21 201 lb 0.2 oz (91.2 kg)  ? ? ? ?Physical Exam ?Vitals and nursing note reviewed.  ?Constitutional:   ?   General: He is not in acute distress. ?   Appearance: He is well-developed.  ?Cardiovascular:  ?   Rate and Rhythm: Normal rate and regular rhythm.  ?Pulmonary:  ?  Effort: Pulmonary effort is normal.  ?   Breath sounds: Normal breath sounds.  ?Skin: ?   General: Skin is warm and dry.  ?Neurological:  ?   Mental Status: He is alert and oriented to person, place, and time.  ? ? ? ?Lab Results: ? ?CBC ?   ?Component Value Date/Time  ? WBC 5.7 03/05/2021 0829  ? RBC 4.64 03/05/2021 0829  ? HGB 15.4 03/05/2021 0829  ? HGB 14.7 04/05/2018 1632  ? HCT 43.1 03/05/2021 0829  ? HCT 44.2 04/05/2018 1632  ? PLT 208 03/05/2021 0829  ? PLT 229 04/05/2018 1632  ? MCV 92.9 03/05/2021 0829  ? MCV 96 04/05/2018 1632  ? MCH 33.2 03/05/2021 0829  ? MCHC 35.7 03/05/2021 0829  ? RDW 12.9 03/05/2021 0829   ? RDW 14.5 04/05/2018 1632  ? LYMPHSABS 2.7 03/05/2021 0829  ? MONOABS 0.3 03/05/2021 0829  ? EOSABS 0.1 03/05/2021 0829  ? BASOSABS 0.1 03/05/2021 0829  ? ? ?BMET ?   ?Component Value Date/Time  ? NA 141 09/23/2021 1119  ? K 4.5 09/23/2021 1119  ? CL 105 09/23/2021 1119  ? CO2 25 03/05/2021 0829  ? GLUCOSE 100 (H) 09/23/2021 1119  ? GLUCOSE 332 (H) 03/05/2021 0829  ? BUN 17 09/23/2021 1119  ? CREATININE 0.75 (L) 09/23/2021 1119  ? CALCIUM 9.3 09/23/2021 1119  ? GFRNONAA >60 03/05/2021 0829  ? GFRAA 88 04/05/2018 1632  ? ? ?BNP ?No results found for: BNP ? ?ProBNP ?No results found for: PROBNP ? ?Imaging: ?No results found. ? ? ?Assessment & Plan:  ? ?DM type 2, goal HbA1c < 7% (HCC) ? ?Lab Results  ?Component Value Date  ? HGBA1C 6.1 (A) 12/23/2021  ? HGBA1C 6.1 12/23/2021  ? HGBA1C 6.1 12/23/2021  ? HGBA1C 6.1 12/23/2021  ? ? ? ?- HgB A1c ?- CBC ?- Comprehensive metabolic panel ?- metFORMIN (GLUCOPHAGE) 1000 MG tablet; Take 1 tablet (1,000 mg total) by mouth 2 (two) times daily with a meal.  Dispense: 180 tablet; Refill: 3 ?- simvastatin (ZOCOR) 5 MG tablet; Take 1 tablet (5 mg total) by mouth daily.  Dispense: 90 tablet; Refill: 3 ?- glucose blood test strip; Use as instructed  Dispense: 100 each; Refill: 12 ? ?Diabetic diet ? ?Stay active ? ?Follow up: ? ? ? ? ?Fenton Foy, NP ?12/23/2021 ? ?

## 2021-12-23 NOTE — Assessment & Plan Note (Signed)
?  Lab Results  ?Component Value Date  ? HGBA1C 6.1 (A) 12/23/2021  ? HGBA1C 6.1 12/23/2021  ? HGBA1C 6.1 12/23/2021  ? HGBA1C 6.1 12/23/2021  ? ? ? ?- HgB A1c ?- CBC ?- Comprehensive metabolic panel ?- metFORMIN (GLUCOPHAGE) 1000 MG tablet; Take 1 tablet (1,000 mg total) by mouth 2 (two) times daily with a meal.  Dispense: 180 tablet; Refill: 3 ?- simvastatin (ZOCOR) 5 MG tablet; Take 1 tablet (5 mg total) by mouth daily.  Dispense: 90 tablet; Refill: 3 ?- glucose blood test strip; Use as instructed  Dispense: 100 each; Refill: 12 ? ?Diabetic diet ? ?Stay active ? ?Follow up: ? ?

## 2021-12-23 NOTE — Patient Instructions (Addendum)
1. Uncontrolled type 2 diabetes mellitus with hyperglycemia ? ?Lab Results  ?Component Value Date  ? HGBA1C 6.1 (A) 12/23/2021  ? HGBA1C 6.1 12/23/2021  ? HGBA1C 6.1 12/23/2021  ? HGBA1C 6.1 12/23/2021  ? ? ? ?- HgB A1c ?- CBC ?- Comprehensive metabolic panel ?- metFORMIN (GLUCOPHAGE) 1000 MG tablet; Take 1 tablet (1,000 mg total) by mouth 2 (two) times daily with a meal.  Dispense: 180 tablet; Refill: 3 ?- simvastatin (ZOCOR) 5 MG tablet; Take 1 tablet (5 mg total) by mouth daily.  Dispense: 90 tablet; Refill: 3 ?- glucose blood test strip; Use as instructed  Dispense: 100 each; Refill: 12 ? ?Diabetic diet ? ?Stay active ? ?Follow up: ? ?Follow up in 3 months or sooner if needed ? ?

## 2021-12-24 LAB — CBC
Hematocrit: 42.5 % (ref 37.5–51.0)
Hemoglobin: 15 g/dL (ref 13.0–17.7)
MCH: 32.5 pg (ref 26.6–33.0)
MCHC: 35.3 g/dL (ref 31.5–35.7)
MCV: 92 fL (ref 79–97)
Platelets: 208 10*3/uL (ref 150–450)
RBC: 4.62 x10E6/uL (ref 4.14–5.80)
RDW: 13 % (ref 11.6–15.4)
WBC: 7.1 10*3/uL (ref 3.4–10.8)

## 2021-12-24 LAB — COMPREHENSIVE METABOLIC PANEL
ALT: 73 IU/L — ABNORMAL HIGH (ref 0–44)
AST: 43 IU/L — ABNORMAL HIGH (ref 0–40)
Albumin/Globulin Ratio: 1.6 (ref 1.2–2.2)
Albumin: 4.3 g/dL (ref 4.0–5.0)
Alkaline Phosphatase: 59 IU/L (ref 44–121)
BUN/Creatinine Ratio: 19 (ref 9–20)
BUN: 16 mg/dL (ref 6–24)
Bilirubin Total: 0.3 mg/dL (ref 0.0–1.2)
CO2: 25 mmol/L (ref 20–29)
Calcium: 9.3 mg/dL (ref 8.7–10.2)
Chloride: 104 mmol/L (ref 96–106)
Creatinine, Ser: 0.84 mg/dL (ref 0.76–1.27)
Globulin, Total: 2.7 g/dL (ref 1.5–4.5)
Glucose: 119 mg/dL — ABNORMAL HIGH (ref 70–99)
Potassium: 4.6 mmol/L (ref 3.5–5.2)
Sodium: 140 mmol/L (ref 134–144)
Total Protein: 7 g/dL (ref 6.0–8.5)
eGFR: 112 mL/min/{1.73_m2} (ref >59)

## 2021-12-25 ENCOUNTER — Other Ambulatory Visit: Payer: Self-pay | Admitting: Nurse Practitioner

## 2021-12-25 DIAGNOSIS — R7989 Other specified abnormal findings of blood chemistry: Secondary | ICD-10-CM

## 2022-02-04 ENCOUNTER — Encounter: Payer: Self-pay | Admitting: Gastroenterology

## 2022-03-05 ENCOUNTER — Encounter: Payer: Self-pay | Admitting: Gastroenterology

## 2022-03-05 ENCOUNTER — Ambulatory Visit: Payer: BC Managed Care – PPO | Admitting: Gastroenterology

## 2022-03-05 ENCOUNTER — Other Ambulatory Visit (INDEPENDENT_AMBULATORY_CARE_PROVIDER_SITE_OTHER): Payer: BC Managed Care – PPO

## 2022-03-05 VITALS — BP 100/62 | HR 80 | Ht 63.0 in | Wt 203.2 lb

## 2022-03-05 DIAGNOSIS — R748 Abnormal levels of other serum enzymes: Secondary | ICD-10-CM

## 2022-03-05 DIAGNOSIS — K760 Fatty (change of) liver, not elsewhere classified: Secondary | ICD-10-CM

## 2022-03-05 LAB — HEPATIC FUNCTION PANEL
ALT: 62 U/L — ABNORMAL HIGH (ref 0–53)
AST: 39 U/L — ABNORMAL HIGH (ref 0–37)
Albumin: 4.5 g/dL (ref 3.5–5.2)
Alkaline Phosphatase: 50 U/L (ref 39–117)
Bilirubin, Direct: 0.2 mg/dL (ref 0.0–0.3)
Total Bilirubin: 0.6 mg/dL (ref 0.2–1.2)
Total Protein: 7.8 g/dL (ref 6.0–8.3)

## 2022-03-05 LAB — GAMMA GT: GGT: 57 U/L — ABNORMAL HIGH (ref 7–51)

## 2022-03-05 NOTE — Progress Notes (Signed)
HPI : Tim Calderon is a very pleasant 41 year old male with a history of AML status post bone marrow transplant in 2012 in remission and diabetes who is referred to [place] by Lazaro Arms, NP for further evaluation management of chronically elevated liver enzymes.  He primarily speaks Spanish, so is accompanied by a Forensic scientist in clinic today. The earliest liver enzyme test I can see in her records is from 2019, at which time he had ALT 92, AST 66, normal T. bili and alk phos.  The patient thinks he is liver enzymes have been elevated ever since he began treatment for his leukemia back in 2009. I see that he had an ultrasound of his liver in 2012 to evaluate elevated liver enzymes which showed increased echogenicity of the liver, suggestive of steatosis. A CT scan in February 2022 also demonstrated fatty change of the liver. There is mention of a history of iron overload and his hematology notes His liver enzymes have been persistently elevated over the past few years, with a peak ALT of 247 in July 2022.  His most recent CMP was May of this year, with an ALT of 73, AST 43.  His T. bili and alk phos have always been normal.  His aminotransferase elevation has always been ALT predominant. The patient denies any history of heavy alcohol use.  Historically, he would drink socially, at parties typically a few beers a few times a month.  For the past few months he is hardly drink any alcohol at all. He was diagnosed with diabetes about a year ago, and his A1c has been less than 7 since diagnosis. He does report frequent soda consumption, usually 1/day.  He is starting to increase his physical activity, participating in soccer with his children. His grandfather had cirrhosis, but was a heavy drinker.  Otherwise, he does not know of any family history of liver disease.  He denies any chronic GI symptoms.  Specifically no abdominal pain, constipation, diarrhea or blood in stool.  He denies any history  of jaundice, dark urine or abdominal swelling.  He has had occasional swelling in his feet, but this is infrequent.  Past Medical History:  Diagnosis Date   Diabetes mellitus without complication ([place])    Leukemia ([place])      Past Surgical History:  Procedure Laterality Date   CHEST SURGERY    Bone marrow transplant Family History  Problem Relation Age of Onset   Diabetes Mother    Diabetes Father    Diabetes Sister    Diabetes Brother    Colon cancer Neg Hx    Esophageal cancer Neg Hx    Social History   Tobacco Use   Smoking status: Some Days    Types: Cigarettes   Smokeless tobacco: Never   Tobacco comments:    Says he smokes 1  cig rarely  Vaping Use   Vaping Use: Never used  Substance Use Topics   Alcohol use: Yes    Comment: occ   Drug use: No   Current Outpatient Medications  Medication Sig Dispense Refill   blood glucose meter kit and supplies KIT Dispense based on patient and insurance preference. Use up to four times daily as directed. 1 each 0   glucose blood test strip Use as instructed 100 each 12   metFORMIN (GLUCOPHAGE) 1000 MG tablet Take 1 tablet (1,000 mg total) by mouth 2 (two) times daily with a meal. 180 tablet 3   simvastatin (ZOCOR) 5 MG tablet  Take 1 tablet (5 mg total) by mouth daily. 90 tablet 3   No current facility-administered medications for this visit.   Allergies  Allergen Reactions   Cytarabine Rash and Shortness Of Breath    Other reaction(s): Other (See Comments) [Rash] [Other] SOB, chest pain   Shrimp (Diagnostic) Anaphylaxis, Nausea And Vomiting and Rash   Vancomycin     Other reaction(s): Other (See Comments) Premed with Benadryl   Cefepime Rash   Ceftazidime Rash   Shellfish-Derived Products Rash     Review of Systems: All systems reviewed and negative except where noted in HPI.    No results found.  Physical Exam: BP 100/62   Pulse 80   Ht '5\' 3"'  (1.6 m)   Wt 203 lb 4 oz (92.2 kg)   BMI 36.00 kg/m   Constitutional: Pleasant,well-developed, Hispanic male in no acute distress.  Accompanied by medical translator HEENT: Normocephalic and atraumatic. Conjunctivae are normal. No scleral icterus. Cardiovascular: Normal rate, regular rhythm.  Pulmonary/chest: Effort normal and breath sounds normal. No wheezing, rales or rhonchi. Abdominal: Soft, nondistended, nontender. Bowel sounds active throughout. There are no masses palpable. No hepatomegaly. Extremities: no edema Neurological: Alert and oriented to person place and time. Skin: Skin is warm and dry.  Hyperpigmentation noted on the left forearm and dorsum of hands Psychiatric: Normal mood and affect. Behavior is normal.  CBC    Component Value Date/Time   WBC 7.1 12/23/2021 0917   WBC 5.7 03/05/2021 0829   RBC 4.62 12/23/2021 0917   RBC 4.64 03/05/2021 0829   HGB 15.0 12/23/2021 0917   HCT 42.5 12/23/2021 0917   PLT 208 12/23/2021 0917   MCV 92 12/23/2021 0917   MCH 32.5 12/23/2021 0917   MCH 33.2 03/05/2021 0829   MCHC 35.3 12/23/2021 0917   MCHC 35.7 03/05/2021 0829   RDW 13.0 12/23/2021 0917   LYMPHSABS 2.7 03/05/2021 0829   MONOABS 0.3 03/05/2021 0829   EOSABS 0.1 03/05/2021 0829   BASOSABS 0.1 03/05/2021 0829    CMP     Component Value Date/Time   NA 140 12/23/2021 0917   K 4.6 12/23/2021 0917   CL 104 12/23/2021 0917   CO2 25 12/23/2021 0917   GLUCOSE 119 (H) 12/23/2021 0917   GLUCOSE 332 (H) 03/05/2021 0829   BUN 16 12/23/2021 0917   CREATININE 0.84 12/23/2021 0917   CALCIUM 9.3 12/23/2021 0917   PROT 7.0 12/23/2021 0917   ALBUMIN 4.3 12/23/2021 0917   AST 43 (H) 12/23/2021 0917   ALT 73 (H) 12/23/2021 0917   ALKPHOS 59 12/23/2021 0917   BILITOT 0.3 12/23/2021 0917   GFRNONAA >60 03/05/2021 0829   GFRAA 88 04/05/2018 1632   Narrative & Impression CLINICAL DATA:  Diverticulitis suspected, diarrhea x3 days.   EXAM: CT ABDOMEN AND PELVIS WITH CONTRAST   TECHNIQUE: Multidetector CT imaging of the  abdomen and pelvis was performed using the standard protocol following bolus administration of intravenous contrast.   CONTRAST:  14m OMNIPAQUE IOHEXOL 300 MG/ML  SOLN   COMPARISON:  None.   FINDINGS: Lower chest: No acute abnormality. Normal size heart. No pericardial effusion.   Hepatobiliary: Mild diffuse hepatic steatosis. Gallbladder is unremarkable. No biliary ductal dilation.   Pancreas: Unremarkable. No pancreatic ductal dilatation or surrounding inflammatory changes.   Spleen: Unremarkable   Adrenals/Urinary Tract: Bilateral adrenal glands are unremarkable. No hydronephrosis. No suspicious renal masses. No filling defect visualized in the opacified portions of the collecting system and ureter on delayed imaging. Urinary  bladder is unremarkable for degree of distension.   Stomach/Bowel: Stomach is unremarkable. Normal positioning of the duodenum/ligament of [place]. No suspicious small bowel wall thickening or dilation. The appendix is not definitely visualized however there is no inflammatory stranding along the cecum or in the right lower quadrant. Colonic diverticulosis without acute inflammation to suggest diverticulitis. Gas fluid levels throughout the colon.   Vascular/Lymphatic: No significant vascular findings are present. No enlarged abdominal or pelvic lymph nodes.   Reproductive: Prostate is unremarkable.   Other: Asymmetric increased fat in the left inguinal canal.   Musculoskeletal: No acute osseous abnormality.   IMPRESSION: 1. Colonic diverticulosis without evidence of acute diverticulitis. 2. Gas fluid levels throughout the colon suggestive of diarrheal illness. 3. Mild diffuse hepatic steatosis.     Electronically Signed   By: Dahlia Bailiff MD   On: 10/21/2020 15:54     ASSESSMENT AND PLAN: 41 year old male with chronically elevated liver enzymes, obesity, diabetes and steatosis noted on ultrasound over 10 years ago without any  significant alcohol history.  Suspect the patient most likely has nonalcoholic fatty liver disease, although there may be a component of hemosiderosis given his reported history of iron overload in his medical record.  Given the long duration of his steatosis and elevated liver enzymes, would be concern for NASH.  We will evaluate for other causes of chronic liver disease (autoimmune, viral, genetic).  We will obtain FibroScan to assess for fibrosis.  We discussed the pathophysiology and natural history of nonalcoholic fatty liver disease and the risk for advanced fibrosis/cirrhosis in the absence of meaningful lifestyle changes.  I advised that he start by cutting out soda.  We can follow-up after his labs and FibroScan to further discuss management of fatty liver disease.  Elevated liver enzymes/fatty liver - Evaluate for other causes of chronic liver disease (autoimmune, viral, genetic) - Fibroscan -Continue to improve diet by reducing consumption of sugar based foods and drinks, continue to increase physical activity -Follow-up in clinic to discuss fatty liver disease once all tests are completed.  Drexel Ivey E. Candis Schatz, MD [facility]  CC:  Fenton Foy, NP

## 2022-03-05 NOTE — Patient Instructions (Addendum)
If you are age 42 or older, your body mass index should be between 23-30. Your Body mass index is 36 kg/m. If this is out of the aforementioned range listed, please consider follow up with your Primary Care Provider.  If you are age 66 or younger, your body mass index should be between 19-25. Your Body mass index is 36 kg/m. If this is out of the aformentioned range listed, please consider follow up with your Primary Care Provider.   Your provider has requested that you go to the basement level for lab work before leaving today. Press "B" on the elevator. The lab is located at the first door on the left as you exit the elevator.  You have been scheduled for an abdominal ultrasound with elastography at [facility]  Radiology (1st floor). Your appointment is scheduled for 03/13/22 at 7am. Please arrive 15 minutes prior to your scheduled appointment for registration purposes. Make certain not to have anything to eat or drink 6 hours prior to your procedure. Should you need to reschedule your appointment, you may contact radiology at [phone].  Liver Elastography Various chronic liver diseases such as hepatitis B, C, and fatty liver disease can lead to tissue damage and subsequent scar tissue formation. As the scar tissue accumulates, the liver loses some of its elasticity and becomes stiffer. Liver elastography involves the use of a surface ultrasound probe that delivers a low frequency pulse or shear wave to a small volume of liver tissue under the rib cage. The transmission of the sound wave is completely painless. How Is a Liver Elastography Performed? The liver is located in the right upper abdomen under the rib cage. Patients are asked to lie flat on an examination table. A technician places the FibroScan probe between the ribs on the right side of the lower chest wall. A series of 10 painless pulses are then applied to the liver. The results are recorded on the equipment and an overall liver  stiffness score is generated. This score is then interpreted by a qualified physician to predict the likelihood of advanced fibrosis or cirrhosis.  Patients are asked to wear loose clothing and should not consume any liquids or solids for a minimum of 4 hours before the test to increase the likelihood of obtaining reliable test results. The scan will take 10 to 15 minutes to complete, but patients should plan on being available for 30 minutes to allow time for preparation   ________________________________________________________  The [facility] providers would like to encourage you to use [facility] to communicate with providers for non-urgent requests or questions.  Due to long hold times on the telephone, sending your provider a message by [facility] may be a faster and more efficient way to get a response.  Please allow 48 business hours for a response.  Please remember that this is for non-urgent requests.  _______________________________________________________

## 2022-03-12 LAB — ALPHA-1-ANTITRYPSIN: A-1 Antitrypsin, Ser: 119 mg/dL (ref 83–199)

## 2022-03-12 LAB — HEPATITIS B SURFACE ANTIBODY,QUALITATIVE: Hep B S Ab: NONREACTIVE

## 2022-03-12 LAB — IGG: IgG (Immunoglobin G), Serum: 1521 mg/dL (ref 600–1640)

## 2022-03-12 LAB — ANTI-SMOOTH MUSCLE ANTIBODY, IGG: Actin (Smooth Muscle) Antibody (IGG): <20 U (ref <20)

## 2022-03-12 LAB — TISSUE TRANSGLUTAMINASE, IGA: (tTG) Ab, IgA: <1 U/mL

## 2022-03-12 LAB — CERULOPLASMIN: Ceruloplasmin: 21 mg/dL (ref 18–36)

## 2022-03-12 LAB — IGA: Immunoglobulin A: 337 mg/dL — ABNORMAL HIGH (ref 47–310)

## 2022-03-12 LAB — HEPATITIS C ANTIBODY: Hepatitis C Ab: NONREACTIVE

## 2022-03-12 LAB — MITOCHONDRIAL ANTIBODIES: Mitochondrial M2 Ab, IgG: <=20 U (ref <=20.0)

## 2022-03-12 LAB — HEPATITIS B SURFACE ANTIGEN: Hepatitis B Surface Ag: NONREACTIVE

## 2022-03-12 NOTE — Progress Notes (Signed)
Tim Calderon,  Please let Mr. Panchal know that his labs looked good.  There were no other causes of chronic liver disease identified, and his elevated liver enzymes are most likely from non-alcoholic fatty liver disease. His liver enzymes continue to be mildly elevated. Please proceed with the Fibroscan and follow up with me in clinic.

## 2022-03-13 ENCOUNTER — Ambulatory Visit (HOSPITAL_COMMUNITY)
Admission: RE | Admit: 2022-03-13 | Discharge: 2022-03-13 | Disposition: A | Discharge status: Home / Self Care | Payer: BC Managed Care – PPO | Source: Ambulatory Visit | Attending: Gastroenterology | Admitting: Gastroenterology

## 2022-03-13 DIAGNOSIS — R748 Abnormal levels of other serum enzymes: Secondary | ICD-10-CM | POA: Insufficient documentation

## 2022-03-13 DIAGNOSIS — R945 Abnormal results of liver function studies: Secondary | ICD-10-CM | POA: Diagnosis not present

## 2022-03-20 NOTE — Progress Notes (Signed)
Tim Calderon,  Please let Tim Calderon know that ultrasound and elastography results were reassuring and did not show findings suggestive of cirrhosis or advanced fibrosis.  The quality of the elastography measurements were not great, however, and may not be completely accurate.  But given the absence of other findings suggestive of cirrhosis, I do not think a liver biopsy is needed at this time.  Please have him follow up with me in clinic and we can discuss this further.

## 2022-03-27 ENCOUNTER — Ambulatory Visit: Payer: BC Managed Care – PPO | Admitting: Nurse Practitioner

## 2022-03-27 ENCOUNTER — Encounter: Payer: Self-pay | Admitting: Nurse Practitioner

## 2022-03-27 VITALS — BP 109/74 | HR 77 | Temp 98.1°F | Ht 63.0 in | Wt 202.6 lb

## 2022-03-27 DIAGNOSIS — E119 Type 2 diabetes mellitus without complications: Secondary | ICD-10-CM | POA: Diagnosis not present

## 2022-03-27 LAB — POCT GLYCOSYLATED HEMOGLOBIN (HGB A1C)
HbA1c POC (<> result, manual entry): 6.2 % (ref 4.0–5.6)
HbA1c, POC (controlled diabetic range): 6.2 % (ref 0.0–7.0)
HbA1c, POC (prediabetic range): 6.2 % (ref 5.7–6.4)
Hemoglobin A1C: 6.2 % — AB (ref 4.0–5.6)

## 2022-03-27 MED ORDER — METFORMIN HCL 1000 MG PO TABS: 1000.0000 mg | ORAL_TABLET | Freq: Two times a day (BID) | ORAL | 3 refills | Status: DC

## 2022-03-27 MED ORDER — SIMVASTATIN 5 MG PO TABS
5.0000 mg | ORAL_TABLET | Freq: Every day | ORAL | 3 refills | Status: DC
Start: 2022-03-27 — End: 2022-06-27

## 2022-03-27 NOTE — Assessment & Plan Note (Signed)
-   POCT glycosylated hemoglobin (Hb A1C) - simvastatin (ZOCOR) 5 MG tablet; Take 1 tablet (5 mg total) by mouth daily.  Dispense: 90 tablet; Refill: 3 - metFORMIN (GLUCOPHAGE) 1000 MG tablet; Take 1 tablet (1,000 mg total) by mouth 2 (two) times daily with a meal.  Dispense: 180 tablet; Refill: 3   Follow up:  Follow up in 3 months or sooner

## 2022-03-27 NOTE — Patient Instructions (Addendum)
1. DM type 2, goal HbA1c < 7% (HCC)  - POCT glycosylated hemoglobin (Hb A1C) - simvastatin (ZOCOR) 5 MG tablet; Take 1 tablet (5 mg total) by mouth daily.  Dispense: 90 tablet; Refill: 3 - metFORMIN (GLUCOPHAGE) 1000 MG tablet; Take 1 tablet (1,000 mg total) by mouth 2 (two) times daily with a meal.  Dispense: 180 tablet; Refill: 3   Follow up:  Follow up in 3 months or sooner

## 2022-03-27 NOTE — Progress Notes (Signed)
_0  ID: Tim Calderon, male    DOB: [birth year], 41 y.o.   MRN: [id number]  Chief Complaint  Patient presents with   Diabetes    Pt is here for 3 month's DM follow up visit.    Referring provider: No ref. provider found   HPI  Tim Calderon presents for follow up. He  has a past medical history of Diabetes mellitus without complication ([place]) and Leukemia ([place]).    He is following up for DM. He reports his CBG 99-150. He denies any side effects of the Metformin. He continues to eat well and exercise with his children. 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  Patient was noted to have elevated liver enzymes at last visit. He was referred to GI and did have labs and [place] completed through them. Denies headache, dizziness, visual changes, shortness of breath, dyspnea on exertion, chest pain, nausea, vomiting or any edema.        Allergies  Allergen Reactions   Cytarabine Rash and Shortness Of Breath    Other reaction(s): Other (See Comments) [Rash] [Other] SOB, chest pain   Shrimp (Diagnostic) Anaphylaxis, Nausea And Vomiting and Rash   Vancomycin     Other reaction(s): Other (See Comments) Premed with Benadryl   Cefepime Rash   Ceftazidime Rash   Shellfish-Derived Products Rash    Immunization History  Administered Date(s) Administered   Influenza Split 07/23/2011, 06/10/2019   Influenza,inj,Quad PF,6+ Mos 05/26/2017, 06/21/2021   Influenza,inj,Quad PF,6-35 Mos 06/17/2019   Influenza,inj,quad, With Preservative 06/26/2014   MMR 12/27/2012   PFIZER(Purple Top)SARS-COV-2 Vaccination 10/27/2019, 12/04/2019, 07/27/2020   Pneumococcal Polysaccharide-23 07/23/2011, 12/27/2012, 03/06/2021   Tdap 01/07/2012, 05/26/2017    Past Medical History:  Diagnosis Date   Diabetes mellitus without complication ([place])    Leukemia ([place])     Tobacco History: Social History   Tobacco Use  Smoking Status Some Days   Types: Cigarettes  Smokeless Tobacco Never  Tobacco Comments    Says he smokes 1  cig rarely   Ready to quit: Not Answered Counseling given: Not Answered Tobacco comments: Says he smokes 1  cig rarely   Outpatient Encounter Medications as of 03/27/2022  Medication Sig   blood glucose meter kit and supplies KIT Dispense based on patient and insurance preference. Use up to four times daily as directed.   glucose blood test strip Use as instructed   [DISCONTINUED] metFORMIN (GLUCOPHAGE) 1000 MG tablet Take 1 tablet (1,000 mg total) by mouth 2 (two) times daily with a meal.   [DISCONTINUED] simvastatin (ZOCOR) 5 MG tablet Take 1 tablet (5 mg total) by mouth daily.   metFORMIN (GLUCOPHAGE) 1000 MG tablet Take 1 tablet (1,000 mg total) by mouth 2 (two) times daily with a meal.   simvastatin (ZOCOR) 5 MG tablet Take 1 tablet (5 mg total) by mouth daily.   No facility-administered encounter medications on file as of 03/27/2022.     Review of Systems  Review of Systems  Constitutional: Negative.   HENT: Negative.    Cardiovascular: Negative.   Gastrointestinal: Negative.   Allergic/Immunologic: Negative.   Neurological: Negative.   Psychiatric/Behavioral: Negative.         Physical Exam  BP 109/74 (BP Location: Right Arm, Patient Position: Sitting, Cuff Size: Large)   Pulse 77   Temp 98.1 F (36.7 C)   Ht _1  (1.6 m)   Wt 202 lb 9.6 oz (91.9 kg)   SpO2 100%   BMI 35.89 kg/m   Wt  Readings from Last 5 Encounters:  03/27/22 202 lb 9.6 oz (91.9 kg)  03/05/22 203 lb 4 oz (92.2 kg)  12/23/21 201 lb 9.6 oz (91.4 kg)  09/23/21 199 lb 3.2 oz (90.4 kg)  06/21/21 201 lb 12.8 oz (91.5 kg)     Physical Exam Vitals and nursing note reviewed.  Constitutional:      General: He is not in acute distress.    Appearance: He is well-developed.  Cardiovascular:     Rate and Rhythm: Normal rate and regular rhythm.  Pulmonary:     Effort: Pulmonary effort is normal.     Breath sounds: Normal breath sounds.  Skin:    General: Skin is warm and dry.   Neurological:     Mental Status: He is alert and oriented to person, place, and time.      Lab Results:  CBC    Component Value Date/Time   WBC 7.1 12/23/2021 0917   WBC 5.7 03/05/2021 0829   RBC 4.62 12/23/2021 0917   RBC 4.64 03/05/2021 0829   HGB 15.0 12/23/2021 0917   HCT 42.5 12/23/2021 0917   PLT 208 12/23/2021 0917   MCV 92 12/23/2021 0917   MCH 32.5 12/23/2021 0917   MCH 33.2 03/05/2021 0829   MCHC 35.3 12/23/2021 0917   MCHC 35.7 03/05/2021 0829   RDW 13.0 12/23/2021 0917   LYMPHSABS 2.7 03/05/2021 0829   MONOABS 0.3 03/05/2021 0829   EOSABS 0.1 03/05/2021 0829   BASOSABS 0.1 03/05/2021 0829    BMET    Component Value Date/Time   NA 140 12/23/2021 0917   K 4.6 12/23/2021 0917   CL 104 12/23/2021 0917   CO2 25 12/23/2021 0917   GLUCOSE 119 (H) 12/23/2021 0917   GLUCOSE 332 (H) 03/05/2021 0829   BUN 16 12/23/2021 0917   CREATININE 0.84 12/23/2021 0917   CALCIUM 9.3 12/23/2021 0917   GFRNONAA >60 03/05/2021 0829   GFRAA 88 04/05/2018 1632    BNP No results found for: "BNP"  ProBNP No results found for: "PROBNP"  Imaging: US ABDOMEN COMPLETE W/ELASTOGRAPHY  Result Date: 03/14/2022 CLINICAL DATA:  Elevated LFTs. EXAM: ULTRASOUND ABDOMEN ULTRASOUND HEPATIC ELASTOGRAPHY TECHNIQUE: Sonography of the upper abdomen was performed. In addition, ultrasound elastography evaluation of the liver was performed. A region of interest was placed within the right lobe of the liver. Following application of a compressive sonographic pulse, tissue compressibility was assessed. Multiple assessments were performed at the selected site. Median tissue compressibility was determined. Previously, hepatic stiffness was assessed by shear wave velocity. Based on recently published Society of Radiologists in Ultrasound consensus article, reporting is now recommended to be performed in the SI units of pressure (kiloPascals) representing hepatic stiffness/elasticity. The obtained  result is compared to the published reference standards. (cACLD = compensated Advanced Chronic Liver Disease) COMPARISON:  CT abdomen pelvis October 21, 2020. FINDINGS: ULTRASOUND ABDOMEN Gallbladder: No gallstones or wall thickening visualized. No sonographic Murphy sign noted by sonographer. Common bile duct: Diameter: 2 mm Liver: No focal lesion identified. Coarsened hepatic echotexture with diffusely increased parenchymal echogenicity and decreased acoustic through transmission. Portal vein is patent on color Doppler imaging with normal direction of blood flow towards the liver. IVC: No abnormality visualized. Pancreas: Visualized portion unremarkable. Spleen: Size and appearance within normal limits. Right Kidney: Length: 11.8 cm. Echogenicity within normal limits. No mass or hydronephrosis visualized. Left Kidney: Length: 11.8 cm. Echogenicity within normal limits. No mass or hydronephrosis visualized. Abdominal aorta: No aneurysm visualized. Other findings: None.  ULTRASOUND HEPATIC ELASTOGRAPHY Device: Siemens Helix VTQ Patient position: Oblique Transducer DAX Number of measurements: 12 Hepatic segment:  8 Median kPa: 4.5 IQR: 2.2 IQR/Median kPa ratio: 0.5 Data quality: IQR/Median kPa ratio of 0.3 or greater indicates reduced accuracy Diagnostic category:  < or = 5 kPa: high probability of being normal The use of hepatic elastography is applicable to patients with viral hepatitis and non-alcoholic fatty liver disease. At this time, there is insufficient data for the referenced cut-off values and use in other causes of liver disease, including alcoholic liver disease. Patients, however, may be assessed by elastography and serve as their own reference standard/baseline. In patients with non-alcoholic liver disease, the values suggesting compensated advanced chronic liver disease (cACLD) may be lower, and patients may need additional testing with elasticity results of 7-9 kPa. Please note that abnormal hepatic  elasticity and shear wave velocities may also be identified in clinical settings other than with hepatic fibrosis, such as: acute hepatitis, elevated right heart and central venous pressures including use of beta blockers, veno-occlusive disease (Budd-Chiari), infiltrative processes such as mastocytosis/amyloidosis/infiltrative tumor/lymphoma, extrahepatic cholestasis, with hyperemia in the post-prandial state, and with liver transplantation. Correlation with patient history, laboratory data, and clinical condition recommended. Diagnostic Categories: < or =5 kPa: high probability of being normal < or =9 kPa: in the absence of other known clinical signs, rules out cACLD >9 kPa and ?13 kPa: suggestive of cACLD, but needs further testing >13 kPa: highly suggestive of cACLD > or =17 kPa: highly suggestive of cACLD with an increased probability of clinically significant portal hypertension IMPRESSION: ULTRASOUND ABDOMEN: Coarsened hepatic echotexture with increased hepatic echogenicity and decreased acoustic penetration nonspecific but most commonly reflects hepatic steatosis. No overt hepatic lesion identified. ULTRASOUND HEPATIC ELASTOGRAPHY: Median kPa:  4.5 Diagnostic category:  < or = 5 kPa: high probability of being normal Electronically Signed   By: Dahlia Bailiff M.D.   On: 03/14/2022 09:03     Assessment & Plan:   DM type 2, goal HbA1c < 7% (HCC) - POCT glycosylated hemoglobin (Hb A1C) - simvastatin (ZOCOR) 5 MG tablet; Take 1 tablet (5 mg total) by mouth daily.  Dispense: 90 tablet; Refill: 3 - metFORMIN (GLUCOPHAGE) 1000 MG tablet; Take 1 tablet (1,000 mg total) by mouth 2 (two) times daily with a meal.  Dispense: 180 tablet; Refill: 3   Follow up:  Follow up in 3 months or sooner     Fenton Foy, NP 03/27/2022

## 2022-04-14 ENCOUNTER — Ambulatory Visit: Payer: BC Managed Care – PPO | Admitting: Gastroenterology

## 2022-06-27 ENCOUNTER — Ambulatory Visit (INDEPENDENT_AMBULATORY_CARE_PROVIDER_SITE_OTHER): Payer: BC Managed Care – PPO | Admitting: Nurse Practitioner

## 2022-06-27 ENCOUNTER — Encounter: Payer: Self-pay | Admitting: Nurse Practitioner

## 2022-06-27 VITALS — BP 115/73 | HR 71 | Temp 98.0°F | Ht 63.0 in | Wt 196.8 lb

## 2022-06-27 DIAGNOSIS — E119 Type 2 diabetes mellitus without complications: Secondary | ICD-10-CM | POA: Diagnosis not present

## 2022-06-27 MED ORDER — SIMVASTATIN 5 MG PO TABS: 5.0000 mg | ORAL_TABLET | Freq: Every day | ORAL | 3 refills | Status: DC

## 2022-06-27 MED ORDER — METFORMIN HCL 1000 MG PO TABS: 1000.0000 mg | ORAL_TABLET | Freq: Two times a day (BID) | ORAL | 3 refills | Status: DC

## 2022-06-27 NOTE — Progress Notes (Signed)
_0  ID: Tim Calderon, male    DOB: [birth year], 41 y.o.   MRN: [id number]  Chief Complaint  Patient presents with   Diabetes    Pt is here for 3 month f/u for DM    Referring provider: Fenton Foy, NP  HPI  Tim Calderon presents for follow up. He  has a past medical history of Diabetes mellitus without complication ([place]) and Leukemia ([place]).     He is following up for DM. He reports his CBG 101-110. He denies any side effects of the Metformin. He continues to eat well and exercise with his children. 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  Patient was noted to have elevated liver enzymes at last visit. He was referred to GI and did have labs and [place] completed through them. Denies headache, dizziness, visual changes, shortness of breath, dyspnea on exertion, chest pain, nausea, vomiting or any edema.       Allergies  Allergen Reactions   Cytarabine Rash and Shortness Of Breath    Other reaction(s): Other (See Comments) [Rash] [Other] SOB, chest pain   Shrimp (Diagnostic) Anaphylaxis, Nausea And Vomiting and Rash   Vancomycin     Other reaction(s): Other (See Comments) Premed with Benadryl   Cefepime Rash   Ceftazidime Rash   Shellfish-Derived Products Rash    Immunization History  Administered Date(s) Administered   Influenza Split 07/23/2011, 06/10/2019   Influenza,inj,Quad PF,6+ Mos 05/26/2017, 06/21/2021   Influenza,inj,Quad PF,6-35 Mos 06/17/2019   Influenza,inj,quad, With Preservative 06/26/2014   MMR 12/27/2012   PFIZER(Purple Top)SARS-COV-2 Vaccination 10/27/2019, 12/04/2019, 07/27/2020   Pneumococcal Polysaccharide-23 07/23/2011, 12/27/2012, 03/06/2021   Tdap 01/07/2012, 05/26/2017    Past Medical History:  Diagnosis Date   Diabetes mellitus without complication ([place])    Leukemia ([place])     Tobacco History: Social History   Tobacco Use  Smoking Status Some Days   Types: Cigarettes  Smokeless Tobacco Never  Tobacco Comments   Says he  smokes 1  cig rarely   Ready to quit: Not Answered Counseling given: Not Answered Tobacco comments: Says he smokes 1  cig rarely   Outpatient Encounter Medications as of 06/27/2022  Medication Sig   blood glucose meter kit and supplies KIT Dispense based on patient and insurance preference. Use up to four times daily as directed.   glucose blood test strip Use as instructed   metFORMIN (GLUCOPHAGE) 1000 MG tablet Take 1 tablet (1,000 mg total) by mouth 2 (two) times daily with a meal.   simvastatin (ZOCOR) 5 MG tablet Take 1 tablet (5 mg total) by mouth daily.   No facility-administered encounter medications on file as of 06/27/2022.     Review of Systems  Review of Systems  Constitutional: Negative.   HENT: Negative.    Cardiovascular: Negative.   Gastrointestinal: Negative.   Allergic/Immunologic: Negative.   Neurological: Negative.   Psychiatric/Behavioral: Negative.         Physical Exam  BP 115/73 (BP Location: Right Arm, Patient Position: Sitting, Cuff Size: Normal)   Pulse 71   Temp 98 F (36.7 C) (Oral)   Ht _1  (1.6 m)   Wt 196 lb 12.8 oz (89.3 kg)   SpO2 99%   BMI 34.86 kg/m   Wt Readings from Last 5 Encounters:  06/27/22 196 lb 12.8 oz (89.3 kg)  03/27/22 202 lb 9.6 oz (91.9 kg)  03/05/22 203 lb 4 oz (92.2 kg)  12/23/21 201 lb 9.6 oz (91.4 kg)  09/23/21 199  lb 3.2 oz (90.4 kg)     Physical Exam Vitals and nursing note reviewed.  Constitutional:      General: He is not in acute distress.    Appearance: He is well-developed.  Cardiovascular:     Rate and Rhythm: Normal rate and regular rhythm.  Pulmonary:     Effort: Pulmonary effort is normal.     Breath sounds: Normal breath sounds.  Skin:    General: Skin is warm and dry.  Neurological:     Mental Status: He is alert and oriented to person, place, and time.      Assessment & Plan:   1. DM type 2, goal HbA1c < 7% (HCC)  - POCT glycosylated hemoglobin (Hb A1C) - metFORMIN  (GLUCOPHAGE) 1000 MG tablet; Take 1 tablet (1,000 mg total) by mouth 2 (two) times daily with a meal.  Dispense: 180 tablet; Refill: 3 - simvastatin (ZOCOR) 5 MG tablet; Take 1 tablet (5 mg total) by mouth daily.  Dispense: 90 tablet; Refill: 3 - CBC - Comprehensive metabolic panel    Follow up:  Follow up in 3 months   Fenton Foy, NP 06/27/2022

## 2022-06-27 NOTE — Patient Instructions (Addendum)
1. DM type 2, goal HbA1c < 7% (HCC)  - POCT glycosylated hemoglobin (Hb A1C) - metFORMIN (GLUCOPHAGE) 1000 MG tablet; Take 1 tablet (1,000 mg total) by mouth 2 (two) times daily with a meal.  Dispense: 180 tablet; Refill: 3 - simvastatin (ZOCOR) 5 MG tablet; Take 1 tablet (5 mg total) by mouth daily.  Dispense: 90 tablet; Refill: 3 - CBC - Comprehensive metabolic panel    Follow up:  Follow up in 3 months

## 2022-06-28 LAB — HEMOGLOBIN A1C
Est. average glucose Bld gHb Est-mCnc: 117 mg/dL
Hgb A1c MFr Bld: 5.7 % — ABNORMAL HIGH (ref 4.8–5.6)

## 2022-06-28 LAB — COMPREHENSIVE METABOLIC PANEL
ALT: 42 IU/L (ref 0–44)
AST: 37 IU/L (ref 0–40)
Albumin/Globulin Ratio: 1.7 (ref 1.2–2.2)
Albumin: 4.6 g/dL (ref 4.1–5.1)
Alkaline Phosphatase: 53 IU/L (ref 44–121)
BUN/Creatinine Ratio: 23 — ABNORMAL HIGH (ref 9–20)
BUN: 19 mg/dL (ref 6–24)
Bilirubin Total: 0.3 mg/dL (ref 0.0–1.2)
CO2: 22 mmol/L (ref 20–29)
Calcium: 9.5 mg/dL (ref 8.7–10.2)
Chloride: 102 mmol/L (ref 96–106)
Creatinine, Ser: 0.82 mg/dL (ref 0.76–1.27)
Globulin, Total: 2.7 g/dL (ref 1.5–4.5)
Glucose: 97 mg/dL (ref 70–99)
Potassium: 4.8 mmol/L (ref 3.5–5.2)
Sodium: 140 mmol/L (ref 134–144)
Total Protein: 7.3 g/dL (ref 6.0–8.5)
eGFR: 113 mL/min/{1.73_m2} (ref >59)

## 2022-06-28 LAB — CBC
Hematocrit: 41.5 % (ref 37.5–51.0)
Hemoglobin: 14.1 g/dL (ref 13.0–17.7)
MCH: 31.8 pg (ref 26.6–33.0)
MCHC: 34 g/dL (ref 31.5–35.7)
MCV: 94 fL (ref 79–97)
Platelets: 214 10*3/uL (ref 150–450)
RBC: 4.44 x10E6/uL (ref 4.14–5.80)
RDW: 13.2 % (ref 11.6–15.4)
WBC: 7.7 10*3/uL (ref 3.4–10.8)

## 2022-10-03 ENCOUNTER — Ambulatory Visit: Payer: BC Managed Care – PPO | Admitting: Nurse Practitioner

## 2022-10-03 ENCOUNTER — Encounter: Payer: Self-pay | Admitting: Nurse Practitioner

## 2022-10-03 VITALS — BP 107/63 | HR 67 | Temp 97.6°F | Wt 196.6 lb

## 2022-10-03 DIAGNOSIS — Z13220 Encounter for screening for lipoid disorders: Secondary | ICD-10-CM | POA: Diagnosis not present

## 2022-10-03 DIAGNOSIS — E119 Type 2 diabetes mellitus without complications: Secondary | ICD-10-CM

## 2022-10-03 DIAGNOSIS — L659 Nonscarring hair loss, unspecified: Secondary | ICD-10-CM

## 2022-10-03 LAB — POCT GLYCOSYLATED HEMOGLOBIN (HGB A1C): Hemoglobin A1C: 6.2 % — AB (ref 4.0–5.6)

## 2022-10-03 MED ORDER — SIMVASTATIN 5 MG PO TABS: 5.0000 mg | ORAL_TABLET | Freq: Every day | ORAL | 3 refills | Status: DC

## 2022-10-03 MED ORDER — METFORMIN HCL 1000 MG PO TABS: 1000.0000 mg | ORAL_TABLET | Freq: Two times a day (BID) | ORAL | 3 refills | Status: DC

## 2022-10-03 NOTE — Progress Notes (Signed)
$@PatientS$  ID: Tim Calderon, male    DOB: [birth year], 42 y.o.   MRN: [id number]  Chief Complaint  Patient presents with   Diabetes    Follow up   Alopecia    Rom chemo would like finesteride    Referring provider: Fenton Foy, NP   HPI  Tim Calderon presents for follow up. He  has a past medical history of Diabetes mellitus without complication ([place]) and Leukemia ([place]).      He is following up for DM. He denies any side effects of the Metformin. He continues to eat well and exercise with his children. 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  Patient was noted to have elevated liver enzymes at last visit. He was referred to GI and did have labs and [place] completed through them. Denies headache, dizziness, visual changes, shortness of breath, dyspnea on exertion, chest pain, nausea, vomiting or any edema.         Allergies  Allergen Reactions   Cytarabine Rash and Shortness Of Breath    Other reaction(s): Other (See Comments) [Rash] [Other] SOB, chest pain   Shrimp (Diagnostic) Anaphylaxis, Nausea And Vomiting and Rash   Vancomycin     Other reaction(s): Other (See Comments) Premed with Benadryl   Cefepime Rash   Ceftazidime Rash   Shellfish-Derived Products Rash    Immunization History  Administered Date(s) Administered   Influenza Split 07/23/2011, 06/10/2019   Influenza,inj,Quad PF,6+ Mos 05/26/2017, 06/21/2021   Influenza,inj,Quad PF,6-35 Mos 06/17/2019   Influenza,inj,quad, With Preservative 06/26/2014   MMR 12/27/2012   PFIZER(Purple Top)SARS-COV-2 Vaccination 10/27/2019, 12/04/2019, 07/27/2020   Pneumococcal Polysaccharide-23 07/23/2011, 12/27/2012, 03/06/2021   Tdap 01/07/2012, 05/26/2017    Past Medical History:  Diagnosis Date   Diabetes mellitus without complication ([place])    Leukemia ([place])     Tobacco History: Social History   Tobacco Use  Smoking Status Some Days   Types: Cigarettes  Smokeless Tobacco Never  Tobacco Comments   Says  he smokes 1  cig rarely   Ready to quit: Not Answered Counseling given: Not Answered Tobacco comments: Says he smokes 1  cig rarely   Outpatient Encounter Medications as of 10/03/2022  Medication Sig   blood glucose meter kit and supplies KIT Dispense based on patient and insurance preference. Use up to four times daily as directed.   glucose blood test strip Use as instructed   [DISCONTINUED] metFORMIN (GLUCOPHAGE) 1000 MG tablet Take 1 tablet (1,000 mg total) by mouth 2 (two) times daily with a meal.   [DISCONTINUED] simvastatin (ZOCOR) 5 MG tablet Take 1 tablet (5 mg total) by mouth daily.   metFORMIN (GLUCOPHAGE) 1000 MG tablet Take 1 tablet (1,000 mg total) by mouth 2 (two) times daily with a meal.   simvastatin (ZOCOR) 5 MG tablet Take 1 tablet (5 mg total) by mouth daily.   No facility-administered encounter medications on file as of 10/03/2022.     Review of Systems  Review of Systems  Constitutional: Negative.   HENT: Negative.    Cardiovascular: Negative.   Gastrointestinal: Negative.   Allergic/Immunologic: Negative.   Neurological: Negative.   Psychiatric/Behavioral: Negative.         Physical Exam  BP 107/63   Pulse 67   Temp 97.6 F (36.4 C)   Wt 196 lb 9.6 oz (89.2 kg)   SpO2 98%   BMI 34.83 kg/m   Wt Readings from Last 5 Encounters:  10/03/22 196 lb 9.6 oz (89.2 kg)  06/27/22  196 lb 12.8 oz (89.3 kg)  03/27/22 202 lb 9.6 oz (91.9 kg)  03/05/22 203 lb 4 oz (92.2 kg)  12/23/21 201 lb 9.6 oz (91.4 kg)     Physical Exam Vitals and nursing note reviewed.  Constitutional:      General: He is not in acute distress.    Appearance: He is well-developed.  Cardiovascular:     Rate and Rhythm: Normal rate and regular rhythm.  Pulmonary:     Effort: Pulmonary effort is normal.     Breath sounds: Normal breath sounds.  Skin:    General: Skin is warm and dry.  Neurological:     Mental Status: He is alert and oriented to person, place, and time.       Lab Results:  CBC    Component Value Date/Time   WBC 7.7 06/27/2022 0849   WBC 5.7 03/05/2021 0829   RBC 4.44 06/27/2022 0849   RBC 4.64 03/05/2021 0829   HGB 14.1 06/27/2022 0849   HCT 41.5 06/27/2022 0849   PLT 214 06/27/2022 0849   MCV 94 06/27/2022 0849   MCH 31.8 06/27/2022 0849   MCH 33.2 03/05/2021 0829   MCHC 34.0 06/27/2022 0849   MCHC 35.7 03/05/2021 0829   RDW 13.2 06/27/2022 0849   LYMPHSABS 2.7 03/05/2021 0829   MONOABS 0.3 03/05/2021 0829   EOSABS 0.1 03/05/2021 0829   BASOSABS 0.1 03/05/2021 0829    BMET    Component Value Date/Time   NA 140 06/27/2022 0849   K 4.8 06/27/2022 0849   CL 102 06/27/2022 0849   CO2 22 06/27/2022 0849   GLUCOSE 97 06/27/2022 0849   GLUCOSE 332 (H) 03/05/2021 0829   BUN 19 06/27/2022 0849   CREATININE 0.82 06/27/2022 0849   CALCIUM 9.5 06/27/2022 0849   GFRNONAA >60 03/05/2021 0829   GFRAA 88 04/05/2018 1632      Assessment & Plan:   DM type 2, goal HbA1c < 7% (HCC) - POCT glycosylated hemoglobin (Hb A1C) - Microalbumin/Creatinine Ratio, Urine - Ambulatory referral to Ophthalmology - CBC - Comprehensive metabolic panel  2. Alopecia  - Ambulatory referral to Dermatology  3. Lipid screening  - Lipid Panel  Follow up:  Follow up in 6 months     Fenton Foy, NP 10/03/2022

## 2022-10-03 NOTE — Assessment & Plan Note (Signed)
-   POCT glycosylated hemoglobin (Hb A1C) - Microalbumin/Creatinine Ratio, Urine - Ambulatory referral to Ophthalmology - CBC - Comprehensive metabolic panel  2. Alopecia  - Ambulatory referral to Dermatology  3. Lipid screening  - Lipid Panel  Follow up:  Follow up in 6 months

## 2022-10-03 NOTE — Patient Instructions (Signed)
1. DM type 2, goal HbA1c < 7% (HCC)  - POCT glycosylated hemoglobin (Hb A1C) - Microalbumin/Creatinine Ratio, Urine - Ambulatory referral to Ophthalmology - CBC - Comprehensive metabolic panel  2. Alopecia  - Ambulatory referral to Dermatology  3. Lipid screening  - Lipid Panel  Follow up:  Follow up in 6 months

## 2022-10-04 LAB — CBC
Hematocrit: 41.6 % (ref 37.5–51.0)
Hemoglobin: 14.5 g/dL (ref 13.0–17.7)
MCH: 31.9 pg (ref 26.6–33.0)
MCHC: 34.9 g/dL (ref 31.5–35.7)
MCV: 91 fL (ref 79–97)
Platelets: 234 10*3/uL (ref 150–450)
RBC: 4.55 x10E6/uL (ref 4.14–5.80)
RDW: 12.8 % (ref 11.6–15.4)
WBC: 6.8 10*3/uL (ref 3.4–10.8)

## 2022-10-04 LAB — COMPREHENSIVE METABOLIC PANEL
ALT: 54 IU/L — ABNORMAL HIGH (ref 0–44)
AST: 39 IU/L (ref 0–40)
Albumin/Globulin Ratio: 1.8 (ref 1.2–2.2)
Albumin: 4.7 g/dL (ref 4.1–5.1)
Alkaline Phosphatase: 50 IU/L (ref 44–121)
BUN/Creatinine Ratio: 15 (ref 9–20)
BUN: 13 mg/dL (ref 6–24)
Bilirubin Total: 0.3 mg/dL (ref 0.0–1.2)
CO2: 25 mmol/L (ref 20–29)
Calcium: 9.5 mg/dL (ref 8.7–10.2)
Chloride: 103 mmol/L (ref 96–106)
Creatinine, Ser: 0.86 mg/dL (ref 0.76–1.27)
Globulin, Total: 2.6 g/dL (ref 1.5–4.5)
Glucose: 108 mg/dL — ABNORMAL HIGH (ref 70–99)
Potassium: 4.9 mmol/L (ref 3.5–5.2)
Sodium: 141 mmol/L (ref 134–144)
Total Protein: 7.3 g/dL (ref 6.0–8.5)
eGFR: 111 mL/min/{1.73_m2} (ref >59)

## 2022-10-04 LAB — LIPID PANEL
Chol/HDL Ratio: 4 ratio (ref 0.0–5.0)
Cholesterol, Total: 157 mg/dL (ref 100–199)
HDL: 39 mg/dL — ABNORMAL LOW (ref >39)
LDL Chol Calc (NIH): 89 mg/dL (ref 0–99)
Triglycerides: 164 mg/dL — ABNORMAL HIGH (ref 0–149)
VLDL Cholesterol Cal: 29 mg/dL (ref 5–40)

## 2022-10-05 LAB — MICROALBUMIN / CREATININE URINE RATIO
Creatinine, Urine: 138.8 mg/dL
Microalb/Creat Ratio: 6 mg/g creat (ref 0–29)
Microalbumin, Urine: 9 ug/mL

## 2022-10-15 NOTE — Progress Notes (Signed)
Lab results mail it to pt home address.

## 2022-12-11 DIAGNOSIS — E119 Type 2 diabetes mellitus without complications: Secondary | ICD-10-CM | POA: Diagnosis not present

## 2022-12-11 DIAGNOSIS — H524 Presbyopia: Secondary | ICD-10-CM | POA: Diagnosis not present

## 2022-12-11 DIAGNOSIS — Q120 Congenital cataract: Secondary | ICD-10-CM | POA: Diagnosis not present

## 2022-12-11 DIAGNOSIS — H25043 Posterior subcapsular polar age-related cataract, bilateral: Secondary | ICD-10-CM | POA: Diagnosis not present

## 2022-12-11 DIAGNOSIS — H35033 Hypertensive retinopathy, bilateral: Secondary | ICD-10-CM | POA: Diagnosis not present

## 2023-04-10 ENCOUNTER — Ambulatory Visit (INDEPENDENT_AMBULATORY_CARE_PROVIDER_SITE_OTHER): Payer: BC Managed Care – PPO | Admitting: Nurse Practitioner

## 2023-04-10 ENCOUNTER — Encounter: Payer: Self-pay | Admitting: Nurse Practitioner

## 2023-04-10 VITALS — BP 112/73 | HR 73 | Ht 63.0 in | Wt 204.0 lb

## 2023-04-10 DIAGNOSIS — Z0000 Encounter for general adult medical examination without abnormal findings: Secondary | ICD-10-CM

## 2023-04-10 DIAGNOSIS — E119 Type 2 diabetes mellitus without complications: Secondary | ICD-10-CM | POA: Diagnosis not present

## 2023-04-10 DIAGNOSIS — R21 Rash and other nonspecific skin eruption: Secondary | ICD-10-CM

## 2023-04-10 DIAGNOSIS — R1031 Right lower quadrant pain: Secondary | ICD-10-CM

## 2023-04-10 LAB — POCT GLYCOSYLATED HEMOGLOBIN (HGB A1C): HbA1c, POC (controlled diabetic range): 7.1 % — AB (ref 0.0–7.0)

## 2023-04-10 MED ORDER — METFORMIN HCL 1000 MG PO TABS
1000.0000 mg | ORAL_TABLET | Freq: Two times a day (BID) | ORAL | 3 refills | Status: DC
Start: 2023-04-10 — End: 2023-12-03

## 2023-04-10 MED ORDER — SIMVASTATIN 5 MG PO TABS: 5.0000 mg | ORAL_TABLET | Freq: Every day | ORAL | 3 refills | Status: AC

## 2023-04-10 MED ORDER — TRIAMCINOLONE ACETONIDE 0.5 % EX OINT: 1.0000 | TOPICAL_OINTMENT | Freq: Two times a day (BID) | CUTANEOUS | 0 refills | Status: DC

## 2023-04-10 NOTE — Progress Notes (Signed)
@Patient  ID: Tim Calderon, male    DOB: 1981-06-25, 42 y.o.   MRN: [id number]  Chief Complaint  Patient presents with   Follow-up    Referring provider: Ivonne Andrew, NP   HPI  Tim Calderon presents for follow up. He  has a past medical history of Diabetes mellitus without complication (HCC) and Leukemia (HCC).      He is following up for DM and physical for work. He denies any side effects of the Metformin. He continues to eat well and exercise with his children. 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  Patient was noted to have elevated liver enzymes at last visit. He was referred to GI and did have labs and [place] completed through them. Denies headache, dizziness, visual changes, shortness of breath, dyspnea on exertion, chest pain, nausea, vomiting or any edema. A1c today is 7.1.       Allergies  Allergen Reactions   Cytarabine Rash and Shortness Of Breath    Other reaction(s): Other (See Comments) [Rash] [Other] SOB, chest pain   Shrimp (Diagnostic) Anaphylaxis, Nausea And Vomiting and Rash   Vancomycin     Other reaction(s): Other (See Comments) Premed with Benadryl   Cefepime Rash   Ceftazidime Rash   Shellfish-Derived Products Rash    Immunization History  Administered Date(s) Administered   Influenza Split 07/23/2011, 06/10/2019   Influenza,inj,Quad PF,6+ Mos 05/26/2017, 06/21/2021   Influenza,inj,Quad PF,6-35 Mos 06/17/2019   Influenza,inj,quad, With Preservative 06/26/2014   MMR 12/27/2012   PFIZER(Purple Top)SARS-COV-2 Vaccination 10/27/2019, 12/04/2019, 07/27/2020   Pneumococcal Polysaccharide-23 07/23/2011, 12/27/2012, 03/06/2021   Tdap 01/07/2012, 05/26/2017   Unspecified SARS-COV-2 Vaccination 10/27/2019, 12/04/2019, 07/27/2020    Past Medical History:  Diagnosis Date   Diabetes mellitus without complication (HCC)    Leukemia (HCC)     Tobacco History: Social History   Tobacco Use  Smoking Status Some Days   Types: Cigarettes   Smokeless Tobacco Never  Tobacco Comments   Says he smokes 1  cig rarely   Ready to quit: Not Answered Counseling given: Not Answered Tobacco comments: Says he smokes 1  cig rarely   Outpatient Encounter Medications as of 04/10/2023  Medication Sig   triamcinolone ointment (KENALOG) 0.5 % Apply 1 Application topically 2 (two) times daily.   blood glucose meter kit and supplies KIT Dispense based on patient and insurance preference. Use up to four times daily as directed.   glucose blood test strip Use as instructed   metFORMIN (GLUCOPHAGE) 1000 MG tablet Take 1 tablet (1,000 mg total) by mouth 2 (two) times daily with a meal.   simvastatin (ZOCOR) 5 MG tablet Take 1 tablet (5 mg total) by mouth daily.   [DISCONTINUED] metFORMIN (GLUCOPHAGE) 1000 MG tablet Take 1 tablet (1,000 mg total) by mouth 2 (two) times daily with a meal.   [DISCONTINUED] simvastatin (ZOCOR) 5 MG tablet Take 1 tablet (5 mg total) by mouth daily.   No facility-administered encounter medications on file as of 04/10/2023.     Review of Systems  Review of Systems  Constitutional: Negative.   HENT: Negative.    Cardiovascular: Negative.   Gastrointestinal: Negative.   Allergic/Immunologic: Negative.   Neurological: Negative.   Psychiatric/Behavioral: Negative.         Physical Exam  BP 112/73   Pulse 73   Ht 5\' 3"  (1.6 m)   Wt 204 lb (92.5 kg)   SpO2 98%   BMI 36.14 kg/m   Wt Readings from Last 5  Encounters:  04/10/23 204 lb (92.5 kg)  10/03/22 196 lb 9.6 oz (89.2 kg)  06/27/22 196 lb 12.8 oz (89.3 kg)  03/27/22 202 lb 9.6 oz (91.9 kg)  03/05/22 203 lb 4 oz (92.2 kg)     Physical Exam Vitals and nursing note reviewed.  Constitutional:      General: He is not in acute distress.    Appearance: He is well-developed.  Cardiovascular:     Rate and Rhythm: Normal rate and regular rhythm.  Pulmonary:     Effort: Pulmonary effort is normal.     Breath sounds: Normal breath sounds.  Skin:     General: Skin is warm and dry.  Neurological:     Mental Status: He is alert and oriented to person, place, and time.      Lab Results:  CBC    Component Value Date/Time   WBC 6.8 10/03/2022 0909   WBC 5.7 03/05/2021 0829   RBC 4.55 10/03/2022 0909   RBC 4.64 03/05/2021 0829   HGB 14.5 10/03/2022 0909   HCT 41.6 10/03/2022 0909   PLT 234 10/03/2022 0909   MCV 91 10/03/2022 0909   MCH 31.9 10/03/2022 0909   MCH 33.2 03/05/2021 0829   MCHC 34.9 10/03/2022 0909   MCHC 35.7 03/05/2021 0829   RDW 12.8 10/03/2022 0909   LYMPHSABS 2.7 03/05/2021 0829   MONOABS 0.3 03/05/2021 0829   EOSABS 0.1 03/05/2021 0829   BASOSABS 0.1 03/05/2021 0829    BMET    Component Value Date/Time   NA 141 10/03/2022 0909   K 4.9 10/03/2022 0909   CL 103 10/03/2022 0909   CO2 25 10/03/2022 0909   GLUCOSE 108 (H) 10/03/2022 0909   GLUCOSE 332 (H) 03/05/2021 0829   BUN 13 10/03/2022 0909   CREATININE 0.86 10/03/2022 0909   CALCIUM 9.5 10/03/2022 0909   GFRNONAA >60 03/05/2021 0829   GFRAA 88 04/05/2018 1632      Assessment & Plan:   DM type 2, goal HbA1c < 7% (HCC) - POCT glycosylated hemoglobin (Hb A1C) - metFORMIN (GLUCOPHAGE) 1000 MG tablet; Take 1 tablet (1,000 mg total) by mouth 2 (two) times daily with a meal.  Dispense: 180 tablet; Refill: 3 - simvastatin (ZOCOR) 5 MG tablet; Take 1 tablet (5 mg total) by mouth daily.  Dispense: 90 tablet; Refill: 3 - CBC - Comprehensive metabolic panel  2. Rash  - triamcinolone ointment (KENALOG) 0.5 %; Apply 1 Application topically 2 (two) times daily.  Dispense: 30 g; Refill: 0  3. Right lower quadrant pain  - US Abdomen Complete   4. Routine adult health maintenance  - CBC - Comprehensive metabolic panel   Follow up:  Follow up in 3 months     Ivonne Andrew, NP 04/10/2023

## 2023-04-10 NOTE — Patient Instructions (Addendum)
1. DM type 2, goal HbA1c < 7% (HCC)  - POCT glycosylated hemoglobin (Hb A1C) - metFORMIN (GLUCOPHAGE) 1000 MG tablet; Take 1 tablet (1,000 mg total) by mouth 2 (two) times daily with a meal.  Dispense: 180 tablet; Refill: 3 - simvastatin (ZOCOR) 5 MG tablet; Take 1 tablet (5 mg total) by mouth daily.  Dispense: 90 tablet; Refill: 3 - CBC - Comprehensive metabolic panel  2. Rash  - triamcinolone ointment (KENALOG) 0.5 %; Apply 1 Application topically 2 (two) times daily.  Dispense: 30 g; Refill: 0  3. Right lower quadrant pain  - US Abdomen Complete   4. Routine adult health maintenance  - CBC - Comprehensive metabolic panel   Follow up:  Follow up in 3 months

## 2023-04-10 NOTE — Assessment & Plan Note (Signed)
-   POCT glycosylated hemoglobin (Hb A1C) - metFORMIN (GLUCOPHAGE) 1000 MG tablet; Take 1 tablet (1,000 mg total) by mouth 2 (two) times daily with a meal.  Dispense: 180 tablet; Refill: 3 - simvastatin (ZOCOR) 5 MG tablet; Take 1 tablet (5 mg total) by mouth daily.  Dispense: 90 tablet; Refill: 3 - CBC - Comprehensive metabolic panel  2. Rash  - triamcinolone ointment (KENALOG) 0.5 %; Apply 1 Application topically 2 (two) times daily.  Dispense: 30 g; Refill: 0  3. Right lower quadrant pain  - US Abdomen Complete   4. Routine adult health maintenance  - CBC - Comprehensive metabolic panel   Follow up:  Follow up in 3 months

## 2023-04-11 LAB — COMPREHENSIVE METABOLIC PANEL
ALT: 9 IU/L (ref 0–44)
AST: 13 IU/L (ref 0–40)
Albumin: 4.4 g/dL (ref 4.1–5.1)
Alkaline Phosphatase: 77 IU/L (ref 44–121)
BUN/Creatinine Ratio: 7 — ABNORMAL LOW (ref 9–20)
BUN: 8 mg/dL (ref 6–24)
Bilirubin Total: 0.7 mg/dL (ref 0.0–1.2)
CO2: 19 mmol/L — ABNORMAL LOW (ref 20–29)
Calcium: 9.5 mg/dL (ref 8.7–10.2)
Chloride: 103 mmol/L (ref 96–106)
Creatinine, Ser: 1.23 mg/dL (ref 0.76–1.27)
Globulin, Total: 2.7 g/dL (ref 1.5–4.5)
Glucose: 96 mg/dL (ref 70–99)
Potassium: 4 mmol/L (ref 3.5–5.2)
Sodium: 139 mmol/L (ref 134–144)
Total Protein: 7.1 g/dL (ref 6.0–8.5)
eGFR: 75 mL/min/{1.73_m2} (ref >59)

## 2023-04-11 LAB — CBC
Hematocrit: 38.6 % (ref 37.5–51.0)
Hemoglobin: 12.6 g/dL — ABNORMAL LOW (ref 13.0–17.7)
MCH: 31.5 pg (ref 26.6–33.0)
MCHC: 32.6 g/dL (ref 31.5–35.7)
MCV: 97 fL (ref 79–97)
Platelets: 234 10*3/uL (ref 150–450)
RBC: 4 x10E6/uL — ABNORMAL LOW (ref 4.14–5.80)
RDW: 11.8 % (ref 11.6–15.4)
WBC: 6.7 10*3/uL (ref 3.4–10.8)

## 2023-04-13 ENCOUNTER — Other Ambulatory Visit: Payer: Self-pay | Admitting: Nurse Practitioner

## 2023-04-13 DIAGNOSIS — G8929 Other chronic pain: Secondary | ICD-10-CM

## 2023-04-14 ENCOUNTER — Ambulatory Visit (HOSPITAL_COMMUNITY): Admission: RE | Admit: 2023-04-14 | Payer: BC Managed Care – PPO | Source: Ambulatory Visit

## 2023-04-14 ENCOUNTER — Encounter (HOSPITAL_COMMUNITY): Payer: Self-pay

## 2023-04-17 ENCOUNTER — Ambulatory Visit (HOSPITAL_COMMUNITY)
Admission: RE | Admit: 2023-04-17 | Discharge: 2023-04-17 | Disposition: A | Discharge status: Home / Self Care | Payer: BC Managed Care – PPO | Source: Ambulatory Visit | Attending: Nurse Practitioner | Admitting: Nurse Practitioner

## 2023-04-17 DIAGNOSIS — R1031 Right lower quadrant pain: Secondary | ICD-10-CM | POA: Diagnosis not present

## 2023-04-17 DIAGNOSIS — G8929 Other chronic pain: Secondary | ICD-10-CM | POA: Insufficient documentation

## 2023-06-28 ENCOUNTER — Ambulatory Visit (HOSPITAL_COMMUNITY)
Admission: EM | Admit: 2023-06-28 | Discharge: 2023-06-28 | Disposition: A | Discharge status: Home / Self Care | Payer: BC Managed Care – PPO | Attending: Family Medicine | Admitting: Family Medicine

## 2023-06-28 ENCOUNTER — Other Ambulatory Visit: Payer: Self-pay

## 2023-06-28 ENCOUNTER — Encounter (HOSPITAL_COMMUNITY): Payer: Self-pay | Admitting: Emergency Medicine

## 2023-06-28 DIAGNOSIS — S01511A Laceration without foreign body of lip, initial encounter: Secondary | ICD-10-CM

## 2023-06-28 DIAGNOSIS — Z23 Encounter for immunization: Secondary | ICD-10-CM

## 2023-06-28 DIAGNOSIS — S0185XA Open bite of other part of head, initial encounter: Secondary | ICD-10-CM | POA: Diagnosis not present

## 2023-06-28 DIAGNOSIS — W540XXA Bitten by dog, initial encounter: Secondary | ICD-10-CM | POA: Diagnosis not present

## 2023-06-28 MED ORDER — LIDOCAINE-EPINEPHRINE 1 %-1:100000 IJ SOLN
INTRAMUSCULAR | Status: AC
  Filled 2023-06-28: qty 1

## 2023-06-28 MED ORDER — BACITRACIN ZINC 500 UNIT/GM EX OINT
TOPICAL_OINTMENT | CUTANEOUS | Status: AC
  Filled 2023-06-28: qty 0.9

## 2023-06-28 MED ORDER — TETANUS-DIPHTH-ACELL PERTUSSIS 5-2.5-18.5 LF-MCG/0.5 IM SUSY
0.5000 mL | PREFILLED_SYRINGE | Freq: Once | INTRAMUSCULAR | Status: AC
  Administered 2023-06-28: 0.5 mL via INTRAMUSCULAR

## 2023-06-28 MED ORDER — DOXYCYCLINE HYCLATE 100 MG PO CAPS
100.0000 mg | ORAL_CAPSULE | Freq: Two times a day (BID) | ORAL | 0 refills | Status: AC
Start: 2023-06-28 — End: 2023-07-05

## 2023-06-28 MED ORDER — TETANUS-DIPHTH-ACELL PERTUSSIS 5-2.5-18.5 LF-MCG/0.5 IM SUSY
PREFILLED_SYRINGE | INTRAMUSCULAR | Status: AC
  Filled 2023-06-28: qty 0.5

## 2023-06-28 NOTE — ED Provider Notes (Signed)
[facility]    CSN: [id number] Arrival date & time: 06/28/23  1648      History   Chief Complaint Chief Complaint  Patient presents with   Animal Bite    HPI Tim Calderon is a 42 y.o. male.    Animal Bite  Here for a cut to his right upper lip.  This evening his 50-month-old poodle puppy jumped up and bit him accidentally on the lip. He has not had any vaccinations yet but he is also not been sick at all.  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 Authorizing Provider  doxycycline (VIBRAMYCIN) 100 MG capsule Take 1 capsule (100 mg total) by mouth 2 (two) times daily for 7 days. 06/28/23 07/05/23 Yes Zenia Resides, MD  blood glucose meter kit and supplies KIT Dispense based on patient and insurance preference. Use up to four times daily as directed. 03/06/21   Barbette Merino, NP  glucose blood test strip Use as instructed 12/23/21   Ivonne Andrew, NP  metFORMIN (GLUCOPHAGE) 1000 MG tablet Take 1 tablet (1,000 mg total) by mouth 2 (two) times daily with a meal. 04/10/23   Ivonne Andrew, NP  simvastatin (ZOCOR) 5 MG tablet Take 1 tablet (5 mg total) by mouth daily. 04/10/23 04/09/24  Ivonne Andrew, NP  triamcinolone ointment (KENALOG) 0.5 % Apply 1 Application topically 2 (two) times daily. 04/10/23   Ivonne Andrew, NP    Family  History Family History  Problem Relation Age of Onset   Diabetes Mother    Diabetes Father    Diabetes Sister    Diabetes Brother    Colon cancer Neg Hx    Esophageal cancer Neg Hx     Social History Social History   Tobacco Use   Smoking status: Some Days    Types: Cigarettes   Smokeless tobacco: Never   Tobacco comments:    Says he smokes 1  cig rarely  Vaping Use   Vaping status: Never Used  Substance Use Topics   Alcohol use: Yes    Comment: occ   Drug use: No     Allergies   Cytarabine, Shrimp (diagnostic), Vancomycin, Cefepime, Ceftazidime, and Shellfish-derived products   Review of Systems Review of Systems   Physical Exam Triage Vital Signs ED Triage Vitals  Encounter Vitals Group     BP 06/28/23 1723 112/78     Systolic BP Percentile --      Diastolic BP Percentile --      Pulse Rate 06/28/23 1723 82     Resp 06/28/23 1723 18     Temp 06/28/23 1723 98 F (36.7 C)     Temp Source 06/28/23 1723 Oral     SpO2 06/28/23 1723 96 %  Weight --      Height --      Head Circumference --      Peak Flow --      Pain Score 06/28/23 1722 0     Pain Loc --      Pain Education --      Exclude from Growth Chart --    No data found.  Updated Vital Signs BP 112/78 (BP Location: Right Arm) Comment (BP Location): large cuff  Pulse 82   Temp 98 F (36.7 C) (Oral)   Resp 18   SpO2 96%   Visual Acuity Right Eye Distance:   Left Eye Distance:   Bilateral Distance:    Right Eye Near:   Left Eye Near:    Bilateral Near:     Physical Exam Vitals reviewed.  Constitutional:      General: He is not in acute distress.    Appearance: He is not ill-appearing, toxic-appearing or diaphoretic.  HENT:     Mouth/Throat:     Mouth: Mucous membranes are moist.     Comments: There is a 2 cm linear laceration in his right upper lip.  It is about 2 to 3 mm deep and does gape open.  There is also a linear shallow abrasion that is very superficial on the left  upper lip. Neurological:     Mental Status: He is alert and oriented to person, place, and time.  Psychiatric:        Behavior: Behavior normal.      UC Treatments / Results  Labs (all labs ordered are listed, but only abnormal results are displayed) Labs Reviewed - No data to display  EKG   Radiology No results found.  Procedures Procedures (including critical care time)  Medications Ordered in UC Medications  Tdap (BOOSTRIX) injection 0.5 mL (0.5 mLs Intramuscular Given 06/28/23 1809)    Initial Impression / Assessment and Plan / UC Course  I have reviewed the triage vital signs and the nursing notes.  Pertinent labs & imaging results that were available during my care of the patient were reviewed by me and considered in my medical decision making (see chart for details).      Options for closure are discussed and risks and benefits are discussed.  I do feel that suturing is a better option compared to the Dermabond in case there were to be any infection in the wound.    Informed verbal consent obtained.  1% lidocaine with epinephrine is used for infiltration anesthesia with good results.  The wound is first cleansed with Hibiclens and then rinsed with 40 cc of normal saline.  Under clean conditions 6-0 nylon is used to close the laceration with good approximation of the skin edges and with good hemostasis.  EBL less than 1 mL and no complications.  Wound care is explained He will return in 5 to 7 days for sutures to be removed  After he was discharged I sent in doxycycline for infection prevention and I let him know by phone that that was going to be at [facility] for him to start tomorrow.  They will turn in the animal control report.  They will observe the animal and if he starts his show signs of illness they will return.  Tdap given here.  Final Clinical Impressions(s) / UC Diagnoses   Final diagnoses:  Lip laceration, initial encounter  Dog bite of face,  initial encounter     Discharge Instructions  I put 2 stitches in your lip.  You need to be removed in 5 to 7 days.  You can return here for this and you can make an online appointment if you wish to do to help you get in and out a little quicker. (Le puse 2 puntos en el labio.  Debe retirarse en 5 a 7 das.  Puede regresar aqu para esto y Data processing manager una cita en lnea si lo desea para ayudarlo a entrar y salir un poco ms rpido.)  You have been given a Tdap vaccination to boost your tetanus immunity. (Le han administrado la vacuna Tdap para aumentar su inmunidad contra el ttanos.)  Please observe your puppy for any signs of illness in the next 10 days.  If he starts acting abnormal you need to take him to the vet and then we should see you for possible rabies vaccinations. (Observe a su cachorro para Insurance risk surveyor signo de enfermedad en los prximos 10 das.  Si comienza a comportarse de Environmental manager, debes llevarlo al veterinario y luego deberamos verte para posibles vacunas contra la rabia.)    ED Prescriptions     Medication Sig Dispense Auth. Provider   doxycycline (VIBRAMYCIN) 100 MG capsule Take 1 capsule (100 mg total) by mouth 2 (two) times daily for 7 days. 14 capsule Marlinda Mike, Janace Aris, MD      PDMP not reviewed this encounter.   Zenia Resides, MD 06/28/23 [phone]

## 2023-06-28 NOTE — Discharge Instructions (Addendum)
I put 2 stitches in your lip.  You need to be removed in 5 to 7 days.  You can return here for this and you can make an online appointment if you wish to do to help you get in and out a little quicker. (Le puse 2 puntos en el labio.  Debe retirarse en 5 a 7 das.  Puede regresar aqu para esto y Data processing manager una cita en lnea si lo desea para ayudarlo a entrar y salir un poco ms rpido.)  You have been given a Tdap vaccination to boost your tetanus immunity. (Le han administrado la vacuna Tdap para aumentar su inmunidad contra el ttanos.)  Please observe your puppy for any signs of illness in the next 10 days.  If he starts acting abnormal you need to take him to the vet and then we should see you for possible rabies vaccinations. (Observe a su cachorro para Insurance risk surveyor signo de enfermedad en los prximos 10 das.  Si comienza a comportarse de Environmental manager, debes llevarlo al veterinario y luego deberamos verte para posibles vacunas contra la rabia.)

## 2023-06-28 NOTE — ED Triage Notes (Addendum)
Dog bite to face occurred today.  There is a straight scratch above upper lip, to the right.  A more superficial abrasion to left side above upper lip.  Patient has a scrape to right middle finger   Dog is approximately 5 months old Has not had rabies shots.   Last tetanus was 2018

## 2023-07-13 ENCOUNTER — Ambulatory Visit: Payer: Self-pay | Admitting: Nurse Practitioner

## 2023-07-24 DIAGNOSIS — Z23 Encounter for immunization: Secondary | ICD-10-CM | POA: Diagnosis not present

## 2023-07-24 DIAGNOSIS — Z9481 Bone marrow transplant status: Secondary | ICD-10-CM | POA: Diagnosis not present

## 2023-07-24 DIAGNOSIS — C9201 Acute myeloblastic leukemia, in remission: Secondary | ICD-10-CM | POA: Diagnosis not present

## 2023-07-27 ENCOUNTER — Ambulatory Visit: Payer: Self-pay | Admitting: Nurse Practitioner

## 2023-08-13 ENCOUNTER — Ambulatory Visit: Payer: Self-pay | Admitting: Nurse Practitioner

## 2023-09-03 ENCOUNTER — Encounter: Payer: Self-pay | Admitting: Nurse Practitioner

## 2023-09-03 ENCOUNTER — Ambulatory Visit (INDEPENDENT_AMBULATORY_CARE_PROVIDER_SITE_OTHER): Payer: BC Managed Care – PPO | Admitting: Nurse Practitioner

## 2023-09-03 VITALS — BP 112/64 | HR 65 | Temp 97.1°F | Wt 202.4 lb

## 2023-09-03 DIAGNOSIS — M5441 Lumbago with sciatica, right side: Secondary | ICD-10-CM

## 2023-09-03 DIAGNOSIS — E119 Type 2 diabetes mellitus without complications: Secondary | ICD-10-CM

## 2023-09-03 DIAGNOSIS — M5442 Lumbago with sciatica, left side: Secondary | ICD-10-CM | POA: Diagnosis not present

## 2023-09-03 LAB — POCT GLYCOSYLATED HEMOGLOBIN (HGB A1C): Hemoglobin A1C: 7.1 % — AB (ref 4.0–5.6)

## 2023-09-03 MED ORDER — CYCLOBENZAPRINE HCL 10 MG PO TABS: 10.0000 mg | ORAL_TABLET | Freq: Three times a day (TID) | ORAL | 0 refills | Status: DC | PRN

## 2023-09-03 MED ORDER — SEMAGLUTIDE(0.25 OR 0.5MG/DOS) 2 MG/3ML ~~LOC~~ SOPN: 0.2500 mg | PEN_INJECTOR | SUBCUTANEOUS | 2 refills | Status: DC

## 2023-09-03 MED ORDER — PREDNISONE 20 MG PO TABS: 20.0000 mg | ORAL_TABLET | Freq: Every day | ORAL | 0 refills | Status: AC

## 2023-09-03 NOTE — Patient Instructions (Signed)
 1. DM type 2, goal HbA1c < 7% (HCC) (Primary)  - POCT glycosylated hemoglobin (Hb A1C) - Semaglutide ,0.25 or 0.5MG /DOS, 2 MG/3ML SOPN; Inject 0.25 mg into the skin once a week.  Dispense: 3 mL; Refill: 2 - AMB Referral VBCI Care Management  2. Acute bilateral low back pain with bilateral sciatica  - cyclobenzaprine  (FLEXERIL ) 10 MG tablet; Take 1 tablet (10 mg total) by mouth 3 (three) times daily as needed for muscle spasms.  Dispense: 30 tablet; Refill: 0 - predniSONE  (DELTASONE ) 20 MG tablet; Take 1 tablet (20 mg total) by mouth daily with breakfast for 5 days.  Dispense: 5 tablet; Refill: 0

## 2023-09-03 NOTE — Progress Notes (Signed)
 Subjective   Patient ID: Tim Calderon, male    DOB: [birth year], 43 y.o.   MRN: [id number]  Chief Complaint  Patient presents with   Diabetes    Referring provider: Oley Bascom RAMAN, NP  [name] is a 43 y.o. male with Past Medical History: No date: Diabetes mellitus without complication (HCC) No date: Leukemia [facility])   HPI  He is following up for DM and physical for work. He denies any side effects of the Metformin . He continues to eat well and exercise with his children. 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  Patient was noted to have elevated liver enzymes at last visit. He was referred to GI and did have labs and US  completed through them. Denies headache, dizziness, visual changes, shortness of breath, dyspnea on exertion, chest pain, nausea, vomiting or any edema. A1c today is 7.1. will place referral to pharmacy for medication management. Will trial ozempic  if we can get it approved. Denies f/c/s, n/v/d, hemoptysis, PND, leg swelling. Denies chest pain or edema.      Allergies  Allergen Reactions   Cytarabine Rash and Shortness Of Breath    Other reaction(s): Other (See Comments) [Rash] [Other] SOB, chest pain   Shrimp (Diagnostic) Anaphylaxis, Nausea And Vomiting and Rash   Vancomycin     Other reaction(s): Other (See Comments) Premed with Benadryl   Cefepime Rash   Ceftazidime Rash   Shellfish-Derived Products Rash    Immunization History  Administered Date(s) Administered   Influenza Split 07/23/2011, 06/10/2019   Influenza,inj,Quad PF,6+ Mos 05/26/2017, 06/21/2021   Influenza,inj,Quad PF,6-35 Mos 06/17/2019   Influenza,inj,quad, With Preservative 06/26/2014   MMR 12/27/2012   Moderna Covid-19 Fall Seasonal Vaccine 32yrs & older 07/24/2023   PFIZER(Purple Top)SARS-COV-2 Vaccination 10/27/2019, 12/04/2019, 07/27/2020   Pneumococcal Polysaccharide-23 07/23/2011, 12/27/2012, 03/06/2021   Tdap 01/07/2012, 05/26/2017, 06/28/2023   Unspecified  SARS-COV-2 Vaccination 10/27/2019, 12/04/2019, 07/27/2020    Tobacco History: Social History   Tobacco Use  Smoking Status Former   Types: Cigarettes  Smokeless Tobacco Never  Tobacco Comments   Says he smokes 1  cig rarely   Counseling given: Yes Tobacco comments: Says he smokes 1  cig rarely   Outpatient Encounter Medications as of 09/03/2023  Medication Sig   blood glucose meter kit and supplies KIT Dispense based on patient and insurance preference. Use up to four times daily as directed.   cyclobenzaprine  (FLEXERIL ) 10 MG tablet Take 1 tablet (10 mg total) by mouth 3 (three) times daily as needed for muscle spasms.   glucose blood test strip Use as instructed   metFORMIN  (GLUCOPHAGE ) 1000 MG tablet Take 1 tablet (1,000 mg total) by mouth 2 (two) times daily with a meal.   predniSONE  (DELTASONE ) 20 MG tablet Take 1 tablet (20 mg total) by mouth daily with breakfast for 5 days.   Semaglutide ,0.25 or 0.5MG /DOS, 2 MG/3ML SOPN Inject 0.25 mg into the skin once a week.   simvastatin  (ZOCOR ) 5 MG tablet Take 1 tablet (5 mg total) by mouth daily.   triamcinolone  ointment (KENALOG ) 0.5 % Apply 1 Application topically 2 (two) times daily.   No facility-administered encounter medications on file as of 09/03/2023.    Review of Systems  Review of Systems  Constitutional: Negative.   HENT: Negative.    Cardiovascular: Negative.   Gastrointestinal: Negative.   Allergic/Immunologic: Negative.   Neurological: Negative.   Psychiatric/Behavioral: Negative.       Objective:   BP 112/64   Pulse 65  Temp (!) 97.1 F (36.2 C)   Wt 202 lb 6.4 oz (91.8 kg)   SpO2 99%   BMI 35.85 kg/m   Wt Readings from Last 5 Encounters:  09/03/23 202 lb 6.4 oz (91.8 kg)  04/10/23 204 lb (92.5 kg)  10/03/22 196 lb 9.6 oz (89.2 kg)  06/27/22 196 lb 12.8 oz (89.3 kg)  03/27/22 202 lb 9.6 oz (91.9 kg)     Physical Exam Vitals and nursing note reviewed.  Constitutional:      General: He is not  in acute distress.    Appearance: He is well-developed.  Cardiovascular:     Rate and Rhythm: Normal rate and regular rhythm.  Pulmonary:     Effort: Pulmonary effort is normal.     Breath sounds: Normal breath sounds.  Skin:    General: Skin is warm and dry.  Neurological:     Mental Status: He is alert and oriented to person, place, and time.       Assessment & Plan:   DM type 2, goal HbA1c < 7% (HCC) -     POCT glycosylated hemoglobin (Hb A1C) -     Semaglutide (0.25 or 0.5MG /DOS); Inject 0.25 mg into the skin once a week.  Dispense: 3 mL; Refill: 2 -     AMB Referral VBCI Care Management -     CBC -     Comprehensive metabolic panel  Acute bilateral low back pain with bilateral sciatica -     Cyclobenzaprine  HCl; Take 1 tablet (10 mg total) by mouth 3 (three) times daily as needed for muscle spasms.  Dispense: 30 tablet; Refill: 0 -     predniSONE ; Take 1 tablet (20 mg total) by mouth daily with breakfast for 5 days.  Dispense: 5 tablet; Refill: 0     Return in about 3 months (around 12/02/2023).     Bascom GORMAN Borer, NP 09/03/2023

## 2023-09-04 ENCOUNTER — Other Ambulatory Visit: Payer: Self-pay | Admitting: Nurse Practitioner

## 2023-09-04 DIAGNOSIS — R7989 Other specified abnormal findings of blood chemistry: Secondary | ICD-10-CM

## 2023-09-04 LAB — COMPREHENSIVE METABOLIC PANEL
ALT: 122 [IU]/L — ABNORMAL HIGH (ref 0–44)
AST: 78 [IU]/L — ABNORMAL HIGH (ref 0–40)
Albumin: 4.2 g/dL (ref 4.1–5.1)
Alkaline Phosphatase: 59 [IU]/L (ref 44–121)
BUN/Creatinine Ratio: 19 (ref 9–20)
BUN: 15 mg/dL (ref 6–24)
Bilirubin Total: 0.5 mg/dL (ref 0.0–1.2)
CO2: 23 mmol/L (ref 20–29)
Calcium: 9.2 mg/dL (ref 8.7–10.2)
Chloride: 104 mmol/L (ref 96–106)
Creatinine, Ser: 0.79 mg/dL (ref 0.76–1.27)
Globulin, Total: 2.6 g/dL (ref 1.5–4.5)
Glucose: 144 mg/dL — ABNORMAL HIGH (ref 70–99)
Potassium: 4.3 mmol/L (ref 3.5–5.2)
Sodium: 141 mmol/L (ref 134–144)
Total Protein: 6.8 g/dL (ref 6.0–8.5)
eGFR: 113 mL/min/{1.73_m2} (ref >59)

## 2023-09-04 LAB — CBC
Hematocrit: 41.8 % (ref 37.5–51.0)
Hemoglobin: 14.1 g/dL (ref 13.0–17.7)
MCH: 32 pg (ref 26.6–33.0)
MCHC: 33.7 g/dL (ref 31.5–35.7)
MCV: 95 fL (ref 79–97)
Platelets: 210 10*3/uL (ref 150–450)
RBC: 4.4 x10E6/uL (ref 4.14–5.80)
RDW: 13.1 % (ref 11.6–15.4)
WBC: 6.9 10*3/uL (ref 3.4–10.8)

## 2023-09-08 ENCOUNTER — Telehealth: Payer: Self-pay | Admitting: Pharmacist

## 2023-09-08 NOTE — Progress Notes (Signed)
Attempted to contact patient for scheduled appointment for medication management. Left HIPAA compliant message for patient to return my call at their convenience.   Will send MyChart.  Catie T. Harper, PharmD, BCACP, CPP Clinical Pharmacist [facility] [phone]  

## 2023-10-01 ENCOUNTER — Telehealth: Payer: Self-pay

## 2023-10-01 DIAGNOSIS — Z7189 Other specified counseling: Secondary | ICD-10-CM

## 2023-10-01 NOTE — Progress Notes (Signed)
   10/01/2023  Patient ID: Tim Calderon, male   DOB: [birth year], 43 y.o.   MRN: [id number]   Attempted to contact patient for medication management/review. Left HIPAA compliant message for patient to return my call at their convenience.   Second attempt for patient outreach. Will follow up with patient in 5-7 business days.  Thank you for allowing pharmacy to be a part of this patient's care.  Dorcas Solian, PharmD Clinical Pharmacist Cell: [phone]

## 2023-10-07 ENCOUNTER — Other Ambulatory Visit: Payer: Self-pay

## 2023-10-07 NOTE — Progress Notes (Signed)
10/07/2023 Name: Tim Calderon MRN: [id number] DOB: [birth year]  Chief Complaint  Patient presents with   Medication Management    Ozempic Administration    Tim Calderon is a 43 y.o. year old male who presented for a telephone visit with interpreter services   They were referred to the pharmacist by their PCP for assistance in managing  Ozempic administration .    Subjective:  Care Team: Primary Care Provider: Ivonne Andrew, NP ; Next Scheduled Visit: 12/03/23   Medication Access/Adherence  Current Pharmacy:  [facility] [address] ([address] [place] - [address] [place] [address] Phone: [phone] Fax: [phone]   Patient reports affordability concerns with their medications: No  Patient reports access/transportation concerns to their pharmacy: No  Patient reports adherence concerns with their medications:  No     Diabetes:  Current medications: Metformin, Ozempic Medications tried in the past: Levemir   Current glucose readings: FBG: 110s - 120s Using AccuChek Guide meter; testing twice daily times daily - few times per week    Patient denies hypoglycemic s/sx including dizziness, shakiness, sweating. Patient denies hyperglycemic symptoms including polyuria, polydipsia, polyphagia, nocturia, neuropathy, blurred vision.  Current meal patterns: Patient reports loss of appetite, skips meals at times or replaces with cereal - Breakfast: coffee, 1 slice of Mexican bread - Lunch: eggs, yogurt - Supper liver, onions, ham, beans - Snacks cereal (i.e. Special K), mango, apples, tangerines - Drinks mostly water (~1L/day), cutting back on sodas - drinks regular soda but smaller can size, green tea  Current physical activity: pt reports minimal physical activity but wants to start going to gym with his kids  Current medication access support: pt reports no afforadbility issues, has $27 copay for Ozempic     Objective:  Lab Results  Component Value Date   HGBA1C 7.1 (A) 09/03/2023    Lab Results  Component Value Date   CREATININE 0.79 09/03/2023   BUN 15 09/03/2023   NA 141 09/03/2023   K 4.3 09/03/2023   CL 104 09/03/2023   CO2 23 09/03/2023    Lab Results  Component Value Date   CHOL 157 10/03/2022   HDL 39 (L) 10/03/2022   LDLCALC 89 10/03/2022   TRIG 164 (H) 10/03/2022   CHOLHDL 4.0 10/03/2022    Medications Reviewed Today     Reviewed by Harlon Flor, [facility] (Pharmacist) on 10/07/23 at 0947  Med List Status: <None>   Medication Order Taking? Sig Documenting Provider Last Dose Status Informant  blood glucose meter kit and supplies KIT [id number] Yes Dispense based on patient and insurance preference. Use up to four times daily as directed. Barbette Merino, NP Taking Active   cyclobenzaprine (FLEXERIL) 10 MG tablet [id number] Yes Take 1 tablet (10 mg total) by mouth 3 (three) times daily as needed for muscle spasms. Ivonne Andrew, NP Taking Active   glucose blood test strip [id number] Yes Use as instructed  Patient taking differently: 1 each in the morning and at bedtime. Use as instructed   Ivonne Andrew, NP Taking Active   metFORMIN (GLUCOPHAGE) 1000 MG tablet [id number] Yes Take 1 tablet (1,000 mg total) by mouth 2 (two) times daily with a meal. Ivonne Andrew, NP Taking Active   Multiple Vitamin (MULTIVITAMIN ADULT PO) [id number] Yes Take 1 tablet by mouth daily. [provider] Taking Active   Semaglutide,0.25 or 0.5MG /DOS, 2 MG/3ML SOPN [id number] Yes Inject 0.25 mg into the skin once  a week. Ivonne Andrew, NP Taking Active   simvastatin (ZOCOR) 5 MG tablet [id number] Yes Take 1 tablet (5 mg total) by mouth daily. Ivonne Andrew, NP Taking Active   triamcinolone ointment (KENALOG) 0.5 % [id number] No Apply 1 Application topically 2 (two) times daily.  Patient not taking: Reported on 10/07/2023   Ivonne Andrew, NP Not Taking Active                Assessment/Plan:   Diabetes: - Currently controlled - Reviewed goal A1c, goal fasting, and goal 2 hour post prandial glucose - Reviewed dietary modifications including replacing regular soda and tea with diet alternatives - Reviewed lifestyle modifications including: increasing physical activity, even a brisk walk 30 minutes  - Counseled patient on proper Ozempic administration technique. Patient tolerating Ozempic well, recommend increasing to 0.5mg  once weekly therapeutic dose. Will collaborate with PCP for dose adjustment. - Patient denies personal or family history of multiple endocrine neoplasia type 2, medullary thyroid cancer; personal history of pancreatitis or gallbladder disease. - Recommend to check glucose at least daily, alternating between fasting and post-prandial.     Follow Up Plan: Will follow-up with patient in 4 weeks to discuss medication management.  Harlon Flor, PharmD Clinical Pharmacist  [phone]

## 2023-10-15 ENCOUNTER — Ambulatory Visit: Payer: BC Managed Care – PPO | Admitting: Dermatology

## 2023-10-16 ENCOUNTER — Encounter: Payer: Self-pay | Admitting: Nurse Practitioner

## 2023-10-29 ENCOUNTER — Other Ambulatory Visit: Payer: Self-pay | Admitting: Nurse Practitioner

## 2023-10-29 DIAGNOSIS — M5442 Lumbago with sciatica, left side: Secondary | ICD-10-CM

## 2023-10-29 MED ORDER — CYCLOBENZAPRINE HCL 10 MG PO TABS
10.0000 mg | ORAL_TABLET | Freq: Three times a day (TID) | ORAL | 0 refills | Status: AC | PRN
Start: 2023-10-29 — End: ?

## 2023-11-17 ENCOUNTER — Ambulatory Visit: Payer: BC Managed Care – PPO | Admitting: Dermatology

## 2023-12-03 ENCOUNTER — Encounter: Payer: Self-pay | Admitting: Nurse Practitioner

## 2023-12-03 ENCOUNTER — Ambulatory Visit: Payer: Self-pay | Admitting: Nurse Practitioner

## 2023-12-03 VITALS — BP 112/66 | HR 73 | Temp 98.0°F | Wt 203.6 lb

## 2023-12-03 DIAGNOSIS — R748 Abnormal levels of other serum enzymes: Secondary | ICD-10-CM | POA: Diagnosis not present

## 2023-12-03 DIAGNOSIS — R21 Rash and other nonspecific skin eruption: Secondary | ICD-10-CM

## 2023-12-03 DIAGNOSIS — Z23 Encounter for immunization: Secondary | ICD-10-CM

## 2023-12-03 DIAGNOSIS — E119 Type 2 diabetes mellitus without complications: Secondary | ICD-10-CM | POA: Diagnosis not present

## 2023-12-03 LAB — POCT GLYCOSYLATED HEMOGLOBIN (HGB A1C): Hemoglobin A1C: 7.5 % — AB (ref 4.0–5.6)

## 2023-12-03 MED ORDER — TRIAMCINOLONE ACETONIDE 0.5 % EX OINT: 1.0000 | TOPICAL_OINTMENT | Freq: Two times a day (BID) | CUTANEOUS | 0 refills | Status: DC

## 2023-12-03 MED ORDER — METFORMIN HCL 1000 MG PO TABS: 1000.0000 mg | ORAL_TABLET | Freq: Two times a day (BID) | ORAL | 3 refills | Status: DC

## 2023-12-03 MED ORDER — WEGOVY 0.25 MG/0.5ML ~~LOC~~ SOAJ: 0.2500 mg | SUBCUTANEOUS | 2 refills | Status: DC

## 2023-12-03 NOTE — Patient Instructions (Addendum)
 1. DM type 2, goal HbA1c < 7% (HCC)  - Urine Albumin/Creatinine with ratio (send out) [LAB689] - POCT glycosylated hemoglobin (Hb A1C)  2. Immunization due (Primary)  - Pneumococcal conjugate vaccine 20-valent (Prevnar 20)  3. Elevated liver enzymes  Please call our office at [phone] and choose option 1, to get this appointment scheduled with [facility].    Diabetes mellitus y nutricin, en adultos Diabetes Mellitus and Nutrition, Adult Si sufre de diabetes, o diabetes mellitus, es muy importante tener hbitos alimenticios saludables debido a que sus niveles de Psychologist, counselling sangre (glucosa) se ven afectados en gran medida por lo que come y bebe. [place]er alimentos saludables en las cantidades correctas, aproximadamente a la misma hora todos los [place], [place] ayudar a: Chief Operating Officer su glucemia. Disminuir el riesgo de sufrir una enfermedad cardaca. Mejorar la presin arterial. Barista o mantener un peso saludable. Qu puede afectar mi plan de alimentacin? Todas las personas que sufren de diabetes son diferentes y cada una tiene necesidades diferentes en cuanto a un plan de alimentacin. El mdico puede recomendarle que trabaje con un nutricionista para elaborar el mejor plan para usted. Su plan de alimentacin puede variar segn factores como: Las caloras que necesita. Los medicamentos que toma. Su peso. Sus niveles de glucemia, presin arterial y colesterol. Su nivel de [place]. Otras afecciones que tenga, como enfermedades cardacas o renales. Cmo me afectan los carbohidratos? Los carbohidratos, o hidratos de carbono, afectan su nivel de glucemia ms que cualquier otro tipo de alimento. La ingesta de carbohidratos aumenta la cantidad de [employer]. Es importante conocer la cantidad de carbohidratos que se pueden ingerir en cada comida sin correr Surveyor, minerals. Esto es Government social research officer. Su nutricionista puede ayudarlo a calcular la cantidad de  carbohidratos que debe ingerir en cada comida y en cada refrigerio. Cmo me afecta el alcohol? El alcohol puede provocar una disminucin de la glucemia (hipoglucemia), especialmente si [place] insulina o toma determinados medicamentos por va oral para la diabetes. La hipoglucemia es una afeccin potencialmente mortal. Los sntomas de la hipoglucemia, como somnolencia, mareos y confusin, son similares a los sntomas de haber consumido demasiado alcohol. No beba alcohol si: Su mdico le indica no hacerlo. Est embarazada, puede estar embarazada o est tratando de [place]. Si bebe alcohol: Limite la cantidad que bebe a lo siguiente: De 0 a 1 medida por da para las mujeres. De 0 a 2 medidas por da para los hombres. Sepa cunta cantidad de alcohol hay en las bebidas que toma. En los [address], una medida equivale a una botella de cerveza de 12 oz (355 ml), un vaso de vino de 5 oz (148 ml) o un vaso de una bebida alcohlica de alta graduacin de 1 oz (44 ml). Mantngase hidratado bebiendo agua, refrescos dietticos o t helado sin azcar. Tenga en cuenta que los refrescos comunes, los jugos y otras bebidas para mezclar pueden contener Product/process development scientist y se deben contar como carbohidratos. Consejos para seguir Social worker las etiquetas de los alimentos Comience por leer el tamao de la porcin en la etiqueta de Informacin nutricional de los alimentos envasados y las bebidas. La cantidad de caloras, carbohidratos, grasas y otros nutrientes detallados en la etiqueta se basan en una porcin del alimento. Muchos alimentos contienen ms de una porcin por envase. Verifique la cantidad total de gramos (g) de carbohidratos totales en una porcin. Verifique la cantidad de gramos de grasas saturadas y grasas trans en una porcin.  Escoja alimentos que no contengan estas grasas o que su contenido de estas sea [place]. Verifique la cantidad de miligramos (mg) de sal (sodio) en una porcin. La [employer]  de las personas deben limitar la ingesta de sodio total a menos de 2300 mg Google. Siempre consulte la informacin nutricional de los alimentos etiquetados como "con bajo contenido de grasa" o "sin grasa". Estos alimentos pueden tener un mayor contenido de International aid/development worker agregada o carbohidratos refinados, y deben evitarse. Hable con su nutricionista para identificar sus objetivos diarios en cuanto a los nutrientes mencionados en la etiqueta. Al ir de compras Evite comprar alimentos procesados, enlatados o precocidos. Estos alimentos tienden a Counselling psychologist mayor cantidad de [place], sodio y azcar agregada. Compre en la zona exterior de la tienda de comestibles. Esta es la zona donde se encuentran con mayor frecuencia las frutas y las verduras frescas, los cereales a granel, las carnes frescas y los productos lcteos frescos. Al cocinar Use mtodos de coccin a baja temperatura, como hornear, en lugar de mtodos de coccin a alta temperatura, como frer en abundante aceite. Cocine con aceites saludables, como el aceite de [place], canola o [place]. Evite cocinar con manteca, crema o carnes con alto contenido de grasa. Planificacin de las comidas Coma las comidas y los refrigerios regularmente, preferentemente a la misma hora todos [place]. Evite pasar largos perodos de tiempo sin comer. Consuma alimentos ricos en fibra, como frutas frescas, verduras, frijoles y cereales integrales. Consuma entre 4 y 6 onzas (entre 112 y 168 g) de protenas magras por da, como carnes [place], pollo, pescado, huevos o tofu. Una onza (oz) (28 g) de protena magra equivale a: 1 onza (28 g) de carne, pollo o pescado. 1 huevo.  taza (62 g) de tofu. Coma algunos alimentos por da que contengan grasas saludables, como aguacates, frutos secos, semillas y pescado. Qu alimentos debo comer? Nils Pyle Bayas. Manzanas. Naranjas. Duraznos. Damascos. Ciruelas. Uvas. Mangos. Papayas. Granadas. Kiwi. Cerezas. Verduras Verduras de [employer], que incluyen [place], [place], col rizada, acelga, hojas de berza, hojas de mostaza y repollo. Remolachas. Coliflor. Brcoli. Zanahorias. Judas verdes. Tomates. Pimientos. Cebollas. Pepinos. Coles de Bruselas. Granos Granos integrales, como panes, galletas, tortillas, cereales y pastas de salvado o integrales. Avena sin azcar. Quinua. Arroz integral o salvaje. Carnes y otras protenas Frutos de mar. Carne de ave sin piel. Cortes magros de ave y carne de res. Tofu. Frutos secos. Semillas. Lcteos Productos lcteos sin grasa o con bajo contenido de [place], [place], yogur y [place]. Es posible que los productos detallados arriba no constituyan una lista completa de los alimentos y las bebidas que puede tomar. Consulte a un nutricionista para obtener ms informacin. Qu alimentos debo evitar? Nils Pyle Frutas enlatadas al almbar. Verduras Verduras enlatadas. Verduras congeladas con mantequilla o salsa de crema. Granos Productos elaborados con [place] y [place], como panes, pastas, bocadillos y cereales. Evite todos los alimentos procesados. Carnes y [address] de carne con alto contenido de Holiday representative. Carne de ave con piel. Carnes empanizadas o fritas. Carne procesada. Evite las grasas saturadas. Lcteos Yogur, queso o [employer] Health. Bebidas Bebidas azucaradas, como gaseosas o t helado. Es posible que los productos que se enumeran ms [place] no constituyan una lista completa de los alimentos y las bebidas que Personnel officer. Consulte a un nutricionista para obtener ms informacin. Preguntas para hacerle al mdico Debo consultar con un especialista certificado en atencin y educacin sobre la diabetes? Es necesario que me rena con un nutricionista? A  qu nmero puedo llamar si tengo preguntas? Cules son los mejores momentos para controlar la glucemia? Dnde encontrar ms informacin: American Diabetes Association (Asociacin Estadounidense de la Diabetes):  diabetes.org Academy of Nutrition and Dietetics (Academia de Nutricin y Pension scheme manager): eatright.Dana Corporation of Diabetes and Digestive and Kidney Diseases [employer] de la Diabetes y las Enfermedades Digestivas y Renales): StageSync.si Association of Diabetes Care & Education Specialists (Asociacin de Especialistas en Atencin y Educacin sobre la Diabetes): diabeteseducator.org Resumen Es importante tener hbitos alimenticios saludables debido a que sus niveles de Psychologist, counselling sangre (glucosa) se ven afectados en gran medida por lo que come y bebe. Es importante consumir alcohol con prudencia. Un plan de comidas saludable lo ayudar a controlar la glucosa en sangre y a reducir el riesgo de enfermedades cardacas. El mdico puede recomendarle que trabaje con un nutricionista para elaborar el mejor plan para usted. Esta informacin no tiene Theme park manager el consejo del mdico. Asegrese de hacerle al mdico cualquier pregunta que tenga. Document Revised: 04/18/2020 Document Reviewed: 04/18/2020 Elsevier Patient Education  2024 [employer].

## 2023-12-03 NOTE — Progress Notes (Signed)
 Subjective   Patient ID: Tim Calderon, male    DOB: [birth year], 43 y.o.   MRN: [id number]  Chief Complaint  Patient presents with   Medical Management of Chronic Issues    Follow up, patient stated that he stopped the Ozempic due to sexual problems     Referring provider: Ivonne Andrew, NP  Tim Calderon is a 43 y.o. male with Past Medical History: No date: Diabetes mellitus without complication (HCC) No date: Leukemia (HCC)   HPI  He is following up for DM. He denies any side effects of the Metformin. He continues to eat well and exercise with his children. 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  Patient was noted to have elevated liver enzymes at last visit. He was referred to GI and did have labs and [place] completed through them. Denies headache, dizziness, visual changes, shortness of breath, dyspnea on exertion, chest pain, nausea, vomiting or any edema. A1c today is 7.5. Started ozempic at last visit, but patient stopped due to side effects. Denies f/c/s, n/v/d, hemoptysis, PND, leg swelling. Denies chest pain or edema.     Allergies  Allergen Reactions   Cytarabine Rash and Shortness Of Breath    Other reaction(s): Other (See Comments) [Rash] [Other] SOB, chest pain   Shrimp (Diagnostic) Anaphylaxis, Nausea And Vomiting and Rash   Vancomycin     Other reaction(s): Other (See Comments) Premed with Benadryl   Cefepime Rash   Ceftazidime Rash   Shellfish-Derived Products Rash    Immunization History  Administered Date(s) Administered   Influenza Split 07/23/2011, 06/10/2019   Influenza,inj,Quad PF,6+ Mos 05/26/2017, 06/21/2021   Influenza,inj,Quad PF,6-35 Mos 06/17/2019   Influenza,inj,quad, With Preservative 06/26/2014   MMR 12/27/2012   Moderna Covid-19 Fall Seasonal Vaccine 35yrs & older 07/24/2023   PFIZER(Purple Top)SARS-COV-2 Vaccination 10/27/2019, 12/04/2019, 07/27/2020   PNEUMOCOCCAL CONJUGATE-20 12/03/2023   Pneumococcal Polysaccharide-23  07/23/2011, 12/27/2012, 03/06/2021   Tdap 01/07/2012, 05/26/2017, 06/28/2023   Unspecified SARS-COV-2 Vaccination 10/27/2019, 12/04/2019, 07/27/2020    Tobacco History: Social History   Tobacco Use  Smoking Status Former   Types: Cigarettes  Smokeless Tobacco Never  Tobacco Comments   Says he smokes 1  cig rarely   Counseling given: Not Answered Tobacco comments: Says he smokes 1  cig rarely   Outpatient Encounter Medications as of 12/03/2023  Medication Sig   blood glucose meter kit and supplies KIT Dispense based on patient and insurance preference. Use up to four times daily as directed.   cyclobenzaprine (FLEXERIL) 10 MG tablet Take 1 tablet (10 mg total) by mouth 3 (three) times daily as needed for muscle spasms.   metFORMIN (GLUCOPHAGE) 1000 MG tablet Take 1 tablet (1,000 mg total) by mouth 2 (two) times daily with a meal.   Multiple Vitamin (MULTIVITAMIN ADULT PO) Take 1 tablet by mouth daily.   Semaglutide-Weight Management (WEGOVY) 0.25 MG/0.5ML SOAJ Inject 0.25 mg into the skin once a week.   simvastatin (ZOCOR) 5 MG tablet Take 1 tablet (5 mg total) by mouth daily.   glucose blood test strip Use as instructed (Patient taking differently: 1 each in the morning and at bedtime. Use as instructed)   triamcinolone ointment (KENALOG) 0.5 % Apply 1 Application topically 2 (two) times daily. (Patient not taking: Reported on 12/03/2023)   [DISCONTINUED] Semaglutide,0.25 or 0.5MG /DOS, 2 MG/3ML SOPN Inject 0.25 mg into the skin once a week. (Patient not taking: Reported on 12/03/2023)   No facility-administered encounter medications on file as of 12/03/2023.  Review of Systems  Review of Systems  Constitutional: Negative.   HENT: Negative.    Cardiovascular: Negative.   Gastrointestinal: Negative.   Allergic/Immunologic: Negative.   Neurological: Negative.   Psychiatric/Behavioral: Negative.       Objective:   BP 112/66   Pulse 73   Temp 98 F (36.7 C) (Oral)   Wt  203 lb 9.6 oz (92.4 kg)   SpO2 96%   BMI 36.07 kg/m   Wt Readings from Last 5 Encounters:  12/03/23 203 lb 9.6 oz (92.4 kg)  09/03/23 202 lb 6.4 oz (91.8 kg)  04/10/23 204 lb (92.5 kg)  10/03/22 196 lb 9.6 oz (89.2 kg)  06/27/22 196 lb 12.8 oz (89.3 kg)     Physical Exam Vitals and nursing note reviewed.  Constitutional:      General: He is not in acute distress.    Appearance: He is well-developed.  Cardiovascular:     Rate and Rhythm: Normal rate and regular rhythm.  Pulmonary:     Effort: Pulmonary effort is normal.     Breath sounds: Normal breath sounds.  Skin:    General: Skin is warm and dry.  Neurological:     Mental Status: He is alert and oriented to person, place, and time.       Assessment & Plan:   Immunization due -     Pneumococcal conjugate vaccine 20-valent  DM type 2, goal HbA1c < 7% (HCC) -     Microalbumin / creatinine urine ratio -     POCT glycosylated hemoglobin (Hb A1C) -     QMVHQI; Inject 0.25 mg into the skin once a week.  Dispense: 2 mL; Refill: 2 -     CBC -     Comprehensive metabolic panel with GFR  Elevated liver enzymes -     Lipid panel     Return in about 3 months (around 03/03/2024).   Ivonne Andrew, NP 12/03/2023

## 2023-12-04 LAB — COMPREHENSIVE METABOLIC PANEL WITH GFR
ALT: 87 IU/L — ABNORMAL HIGH (ref 0–44)
AST: 55 IU/L — ABNORMAL HIGH (ref 0–40)
Albumin: 4.3 g/dL (ref 4.1–5.1)
Alkaline Phosphatase: 65 IU/L (ref 44–121)
BUN/Creatinine Ratio: 13 (ref 9–20)
BUN: 10 mg/dL (ref 6–24)
Bilirubin Total: 0.4 mg/dL (ref 0.0–1.2)
CO2: 24 mmol/L (ref 20–29)
Calcium: 9.3 mg/dL (ref 8.7–10.2)
Chloride: 101 mmol/L (ref 96–106)
Creatinine, Ser: 0.8 mg/dL (ref 0.76–1.27)
Globulin, Total: 2.8 g/dL (ref 1.5–4.5)
Glucose: 140 mg/dL — ABNORMAL HIGH (ref 70–99)
Potassium: 4.5 mmol/L (ref 3.5–5.2)
Sodium: 140 mmol/L (ref 134–144)
Total Protein: 7.1 g/dL (ref 6.0–8.5)
eGFR: 113 mL/min/{1.73_m2} (ref >59)

## 2023-12-04 LAB — CBC
Hematocrit: 43.2 % (ref 37.5–51.0)
Hemoglobin: 15 g/dL (ref 13.0–17.7)
MCH: 32.3 pg (ref 26.6–33.0)
MCHC: 34.7 g/dL (ref 31.5–35.7)
MCV: 93 fL (ref 79–97)
Platelets: 217 10*3/uL (ref 150–450)
RBC: 4.64 x10E6/uL (ref 4.14–5.80)
RDW: 13.1 % (ref 11.6–15.4)
WBC: 6.3 10*3/uL (ref 3.4–10.8)

## 2023-12-04 LAB — MICROALBUMIN / CREATININE URINE RATIO
Creatinine, Urine: 123 mg/dL
Microalb/Creat Ratio: 6 mg/g{creat} (ref 0–29)
Microalbumin, Urine: 7 ug/mL

## 2023-12-04 LAB — LIPID PANEL
Chol/HDL Ratio: 3.9 ratio (ref 0.0–5.0)
Cholesterol, Total: 136 mg/dL (ref 100–199)
HDL: 35 mg/dL — ABNORMAL LOW (ref >39)
LDL Chol Calc (NIH): 77 mg/dL (ref 0–99)
Triglycerides: 138 mg/dL (ref 0–149)
VLDL Cholesterol Cal: 24 mg/dL (ref 5–40)

## 2023-12-07 ENCOUNTER — Other Ambulatory Visit: Payer: Self-pay

## 2023-12-10 ENCOUNTER — Other Ambulatory Visit: Payer: Self-pay | Admitting: Nurse Practitioner

## 2023-12-10 MED ORDER — TRULICITY 0.75 MG/0.5ML ~~LOC~~ SOAJ: 0.7500 mg | SUBCUTANEOUS | 2 refills | Status: DC

## 2023-12-11 ENCOUNTER — Other Ambulatory Visit: Payer: Self-pay

## 2023-12-14 ENCOUNTER — Other Ambulatory Visit: Payer: Self-pay

## 2023-12-15 DIAGNOSIS — H25043 Posterior subcapsular polar age-related cataract, bilateral: Secondary | ICD-10-CM | POA: Diagnosis not present

## 2023-12-15 DIAGNOSIS — H35033 Hypertensive retinopathy, bilateral: Secondary | ICD-10-CM | POA: Diagnosis not present

## 2023-12-15 DIAGNOSIS — Q120 Congenital cataract: Secondary | ICD-10-CM | POA: Diagnosis not present

## 2023-12-15 DIAGNOSIS — E119 Type 2 diabetes mellitus without complications: Secondary | ICD-10-CM | POA: Diagnosis not present

## 2023-12-15 LAB — HM DIABETES EYE EXAM

## 2023-12-18 NOTE — Telephone Encounter (Signed)
 Sorry, our department does not do prior auths for medication.  Please refer this to the clinic nurse.  Thank you

## 2023-12-21 ENCOUNTER — Other Ambulatory Visit: Payer: Self-pay

## 2024-03-04 ENCOUNTER — Ambulatory Visit: Payer: Self-pay | Admitting: Nurse Practitioner

## 2024-03-04 ENCOUNTER — Encounter: Payer: Self-pay | Admitting: Nurse Practitioner

## 2024-03-04 VITALS — BP 102/61 | HR 71 | Temp 97.0°F | Wt 201.0 lb

## 2024-03-04 DIAGNOSIS — R21 Rash and other nonspecific skin eruption: Secondary | ICD-10-CM | POA: Diagnosis not present

## 2024-03-04 DIAGNOSIS — E119 Type 2 diabetes mellitus without complications: Secondary | ICD-10-CM

## 2024-03-04 LAB — POCT GLYCOSYLATED HEMOGLOBIN (HGB A1C): Hemoglobin A1C: 6.8 % — AB (ref 4.0–5.6)

## 2024-03-04 MED ORDER — METFORMIN HCL 1000 MG PO TABS: 1000.0000 mg | ORAL_TABLET | Freq: Two times a day (BID) | ORAL | 3 refills | Status: AC

## 2024-03-04 MED ORDER — TRIAMCINOLONE ACETONIDE 0.5 % EX OINT: 1.0000 | TOPICAL_OINTMENT | Freq: Two times a day (BID) | CUTANEOUS | 0 refills | Status: AC

## 2024-03-04 MED ORDER — CLOTRIMAZOLE 1 % EX CREA: 1.0000 | TOPICAL_CREAM | Freq: Two times a day (BID) | CUTANEOUS | 0 refills | Status: AC

## 2024-03-04 NOTE — Progress Notes (Signed)
 Subjective   Patient ID: Tim Calderon, male    DOB: 01-15-1981, 43 y.o.   MRN: [id number]  Chief Complaint  Patient presents with   Diabetes    Follow up  with concerns of left foot itching    Referring provider: Oley Bascom RAMAN, NP  [name] is a 43 y.o. male with Past Medical History: No date: Diabetes mellitus without complication (HCC) No date: Leukemia [facility])   HPI  Patient is following up for DM. He denies any side effects of the Metformin . He continues to eat well and exercise with his children. 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 Denies headache, dizziness, visual changes, shortness of breath, dyspnea on exertion, chest pain, nausea, vomiting or any edema. A1c today is 6.8. Started ozempic  at last visit, but patient stopped due to side effects. Denies f/c/s, n/v/d, hemoptysis, PND, leg swelling. Denies chest pain or edema.     Allergies  Allergen Reactions   Cytarabine Rash and Shortness Of Breath    Other reaction(s): Other (See Comments) [Rash] [Other] SOB, chest pain   Shrimp (Diagnostic) Anaphylaxis, Nausea And Vomiting and Rash   Vancomycin     Other reaction(s): Other (See Comments) Premed with Benadryl   Cefepime Rash   Ceftazidime Rash   Shellfish-Derived Products Rash    Immunization History  Administered Date(s) Administered   Fluzone Influenza virus vaccine,trivalent (IIV3), split virus 06/26/2014   Influenza Split 07/23/2011, 06/10/2019   Influenza, Seasonal, Injecte, Preservative Fre 07/24/2023   Influenza,inj,Quad PF,6+ Mos 05/26/2017, 06/21/2021   Influenza,inj,Quad PF,6-35 Mos 06/17/2019   Influenza,inj,quad, With Preservative 06/26/2014   MMR 12/27/2012   Moderna Covid-19 Fall Seasonal Vaccine 77yrs & older 07/24/2023   PFIZER(Purple Top)SARS-COV-2 Vaccination 10/27/2019, 12/04/2019, 07/27/2020   PNEUMOCOCCAL CONJUGATE-20 12/03/2023   Pneumococcal Polysaccharide-23 07/23/2011, 12/27/2012, 03/06/2021   Tdap 01/07/2012,  05/09/2016, 05/26/2017, 06/28/2023   Unspecified SARS-COV-2 Vaccination 10/27/2019, 12/04/2019, 07/27/2020    Tobacco History: Social History   Tobacco Use  Smoking Status Former   Types: Cigarettes  Smokeless Tobacco Never  Tobacco Comments   Says he smokes 1  cig rarely   Counseling given: Not Answered Tobacco comments: Says he smokes 1  cig rarely   Outpatient Encounter Medications as of 03/04/2024  Medication Sig   blood glucose meter kit and supplies KIT Dispense based on patient and insurance preference. Use up to four times daily as directed.   clotrimazole  (LOTRIMIN  AF) 1 % cream Apply 1 Application topically 2 (two) times daily.   cyclobenzaprine  (FLEXERIL ) 10 MG tablet Take 1 tablet (10 mg total) by mouth 3 (three) times daily as needed for muscle spasms.   Dulaglutide  (TRULICITY ) 0.75 MG/0.5ML SOAJ Inject 0.75 mg into the skin once a week.   glucose blood test strip Use as instructed   Multiple Vitamin (MULTIVITAMIN ADULT PO) Take 1 tablet by mouth daily.   Semaglutide -Weight Management (WEGOVY ) 0.25 MG/0.5ML SOAJ Inject 0.25 mg into the skin once a week.   simvastatin  (ZOCOR ) 5 MG tablet Take 1 tablet (5 mg total) by mouth daily.   [DISCONTINUED] metFORMIN  (GLUCOPHAGE ) 1000 MG tablet Take 1 tablet (1,000 mg total) by mouth 2 (two) times daily with a meal.   [DISCONTINUED] triamcinolone  ointment (KENALOG ) 0.5 % Apply 1 Application topically 2 (two) times daily.   metFORMIN  (GLUCOPHAGE ) 1000 MG tablet Take 1 tablet (1,000 mg total) by mouth 2 (two) times daily with a meal.   triamcinolone  ointment (KENALOG ) 0.5 % Apply 1 Application topically 2 (two) times daily.  No facility-administered encounter medications on file as of 03/04/2024.    Review of Systems  Review of Systems  Constitutional: Negative.   HENT: Negative.    Cardiovascular: Negative.   Gastrointestinal: Negative.   Allergic/Immunologic: Negative.   Neurological: Negative.   Psychiatric/Behavioral:  Negative.       Objective:   BP 102/61   Pulse 71   Temp (!) 97 F (36.1 C)   Wt 201 lb (91.2 kg)   SpO2 96%   BMI 35.61 kg/m   Wt Readings from Last 5 Encounters:  03/04/24 201 lb (91.2 kg)  12/03/23 203 lb 9.6 oz (92.4 kg)  09/03/23 202 lb 6.4 oz (91.8 kg)  04/10/23 204 lb (92.5 kg)  10/03/22 196 lb 9.6 oz (89.2 kg)     Physical Exam Vitals and nursing note reviewed.  Constitutional:      General: He is not in acute distress.    Appearance: He is well-developed.  Cardiovascular:     Rate and Rhythm: Normal rate and regular rhythm.  Pulmonary:     Effort: Pulmonary effort is normal.     Breath sounds: Normal breath sounds.  Skin:    General: Skin is warm and dry.  Neurological:     Mental Status: He is alert and oriented to person, place, and time.       Assessment & Plan:   DM type 2, goal HbA1c < 7% (HCC) -     POCT glycosylated hemoglobin (Hb A1C) -     metFORMIN  HCl; Take 1 tablet (1,000 mg total) by mouth 2 (two) times daily with a meal.  Dispense: 180 tablet; Refill: 3  Rash -     Triamcinolone  Acetonide; Apply 1 Application topically 2 (two) times daily.  Dispense: 30 g; Refill: 0  Other orders -     Clotrimazole ; Apply 1 Application topically 2 (two) times daily.  Dispense: 30 g; Refill: 0     Return in about 3 months (around 06/04/2024).   Bascom GORMAN Borer, NP 03/04/2024

## 2024-03-10 ENCOUNTER — Other Ambulatory Visit: Payer: Self-pay | Admitting: Nurse Practitioner

## 2024-04-03 ENCOUNTER — Other Ambulatory Visit: Payer: Self-pay | Admitting: Nurse Practitioner

## 2024-04-29 ENCOUNTER — Other Ambulatory Visit: Payer: Self-pay | Admitting: Nurse Practitioner

## 2024-05-24 ENCOUNTER — Other Ambulatory Visit: Payer: Self-pay | Admitting: Nurse Practitioner

## 2024-06-10 ENCOUNTER — Ambulatory Visit: Payer: Self-pay | Admitting: Nurse Practitioner

## 2024-06-10 ENCOUNTER — Encounter: Payer: Self-pay | Admitting: Nurse Practitioner

## 2024-06-10 VITALS — BP 124/69 | HR 69 | Ht 63.0 in | Wt 200.0 lb

## 2024-06-10 DIAGNOSIS — E119 Type 2 diabetes mellitus without complications: Secondary | ICD-10-CM | POA: Diagnosis not present

## 2024-06-10 LAB — POCT GLYCOSYLATED HEMOGLOBIN (HGB A1C): Hemoglobin A1C: 6.6 % — AB (ref 4.0–5.6)

## 2024-06-10 MED ORDER — TRULICITY 0.75 MG/0.5ML ~~LOC~~ SOAJ: 0.7500 mg | SUBCUTANEOUS | 4 refills | Status: AC

## 2024-06-10 NOTE — Progress Notes (Signed)
 Subjective   Patient ID: Tim Calderon, male    DOB: 1980-12-13, 43 y.o.   MRN: [id number]  Chief Complaint  Patient presents with   Medical Management of Chronic Issues    Referring provider: Oley Tim RAMAN, NP  Tim Calderon is a 43 y.o. male with Past Medical History: No date: Diabetes mellitus without complication (HCC) No date: Leukemia [facility])   HPI  Patient is following up for DM. He denies any side effects of the Metformin . He continues to eat well and exercise with his children. 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 Denies headache, dizziness, visual changes, shortness of breath, dyspnea on exertion, chest pain, nausea, vomiting or any edema. A1c today is 6.6. Started ozempic  at last visit, but patient stopped due to side effects. Denies f/c/s, n/v/d, hemoptysis, PND, leg swelling. Denies chest pain or edema.      Allergies  Allergen Reactions   Cytarabine Rash and Shortness Of Breath    Other reaction(s): Other (See Comments) [Rash] [Other] SOB, chest pain   Shrimp (Diagnostic) Anaphylaxis, Nausea And Vomiting and Rash   Vancomycin     Other reaction(s): Other (See Comments) Premed with Benadryl   Cefepime Rash   Ceftazidime Rash   Shellfish Protein-Containing Drug Products Rash    Immunization History  Administered Date(s) Administered   Fluzone Influenza virus vaccine,trivalent (IIV3), split virus 06/26/2014   Influenza Split 07/23/2011, 06/10/2019   Influenza, Seasonal, Injecte, Preservative Fre 07/24/2023   Influenza,inj,Quad PF,6+ Mos 05/26/2017, 06/21/2021   Influenza,inj,Quad PF,6-35 Mos 06/17/2019   Influenza,inj,quad, With Preservative 06/26/2014   MMR 12/27/2012   Moderna Covid-19 Fall Seasonal Vaccine 43yrs & older 07/24/2023   PFIZER(Purple Top)SARS-COV-2 Vaccination 10/27/2019, 12/04/2019, 07/27/2020   PNEUMOCOCCAL CONJUGATE-20 12/03/2023   Pneumococcal Polysaccharide-23 07/23/2011, 12/27/2012, 03/06/2021   Tdap 01/07/2012, 05/09/2016,  05/26/2017, 06/28/2023   Unspecified SARS-COV-2 Vaccination 10/27/2019, 12/04/2019, 07/27/2020    Tobacco History: Social History   Tobacco Use  Smoking Status Former   Types: Cigarettes  Smokeless Tobacco Never  Tobacco Comments   Says he smokes 1  cig rarely   Counseling given: Not Answered Tobacco comments: Says he smokes 1  cig rarely   Outpatient Encounter Medications as of 06/10/2024  Medication Sig   blood glucose meter kit and supplies KIT Dispense based on patient and insurance preference. Use up to four times daily as directed.   clotrimazole  (LOTRIMIN  AF) 1 % cream Apply 1 Application topically 2 (two) times daily.   cyclobenzaprine  (FLEXERIL ) 10 MG tablet Take 1 tablet (10 mg total) by mouth 3 (three) times daily as needed for muscle spasms.   glucose blood test strip Use as instructed   metFORMIN  (GLUCOPHAGE ) 1000 MG tablet Take 1 tablet (1,000 mg total) by mouth 2 (two) times daily with a meal.   Multiple Vitamin (MULTIVITAMIN ADULT PO) Take 1 tablet by mouth daily.   simvastatin  (ZOCOR ) 5 MG tablet Take 1 tablet (5 mg total) by mouth daily.   triamcinolone  ointment (KENALOG ) 0.5 % Apply 1 Application topically 2 (two) times daily.   [DISCONTINUED] Dulaglutide  (TRULICITY ) 0.75 MG/0.5ML SOAJ INJECT 0.75 MG  SUBCUTANEOUSLY ONCE A WEEK   Dulaglutide  (TRULICITY ) 0.75 MG/0.5ML SOAJ Inject 0.75 mg into the skin once a week. INJECT 0.75 MG  SUBCUTANEOUSLY ONCE A WEEK   No facility-administered encounter medications on file as of 06/10/2024.    Review of Systems  Review of Systems  Constitutional: Negative.   HENT: Negative.    Cardiovascular: Negative.   Gastrointestinal: Negative.  Allergic/Immunologic: Negative.   Neurological: Negative.   Psychiatric/Behavioral: Negative.       Objective:   BP 124/69   Pulse 69   Ht 5' 3 (1.6 m)   Wt 200 lb (90.7 kg)   BMI 35.43 kg/m   Wt Readings from Last 5 Encounters:  06/10/24 200 lb (90.7 kg)  03/04/24 201 lb  (91.2 kg)  12/03/23 203 lb 9.6 oz (92.4 kg)  09/03/23 202 lb 6.4 oz (91.8 kg)  04/10/23 204 lb (92.5 kg)     Physical Exam Vitals and nursing note reviewed.  Constitutional:      General: He is not in acute distress.    Appearance: He is well-developed.  Cardiovascular:     Rate and Rhythm: Normal rate and regular rhythm.  Pulmonary:     Effort: Pulmonary effort is normal.     Breath sounds: Normal breath sounds.  Skin:    General: Skin is warm and dry.  Neurological:     Mental Status: He is alert and oriented to person, place, and time.       Assessment & Plan:   DM type 2, goal HbA1c < 7% (HCC) -     Trulicity ; Inject 0.75 mg into the skin once a week. INJECT 0.75 MG  SUBCUTANEOUSLY ONCE A WEEK  Dispense: 4 mL; Refill: 4 -     POCT glycosylated hemoglobin (Hb A1C)     Return in about 3 months (around 09/10/2024).   Tim GORMAN Borer, NP 06/10/2024

## 2024-09-16 ENCOUNTER — Ambulatory Visit: Payer: Self-pay | Admitting: Nurse Practitioner

## 2024-10-21 ENCOUNTER — Ambulatory Visit: Admitting: Nurse Practitioner
